# Patient Record
Sex: Male | Born: 1980 | Race: Black or African American | Hispanic: No | Marital: Single | Smoking: Former smoker
Health system: Southern US, Community
[De-identification: ages and names within clinical notes are randomized; demographics above are authoritative.]

## PROBLEM LIST (undated history)

## (undated) DIAGNOSIS — E119 Type 2 diabetes mellitus without complications: Secondary | ICD-10-CM

## (undated) DIAGNOSIS — I10 Essential (primary) hypertension: Secondary | ICD-10-CM

## (undated) DIAGNOSIS — Z8619 Personal history of other infectious and parasitic diseases: Secondary | ICD-10-CM

## (undated) HISTORY — PX: CATARACT EXTRACTION: SUR2

## (undated) HISTORY — PX: REPAIR KNEE LIGAMENT: SUR1188

---

## 2000-08-03 ENCOUNTER — Emergency Department (HOSPITAL_COMMUNITY): Admission: EM | Admit: 2000-08-03 | Discharge: 2000-08-03 | Payer: Self-pay | Admitting: Emergency Medicine

## 2001-10-15 ENCOUNTER — Ambulatory Visit (HOSPITAL_COMMUNITY): Admission: RE | Admit: 2001-10-15 | Discharge: 2001-10-15 | Payer: Self-pay | Admitting: Family Medicine

## 2001-10-15 ENCOUNTER — Encounter: Payer: Self-pay | Admitting: Family Medicine

## 2004-09-26 ENCOUNTER — Emergency Department (HOSPITAL_COMMUNITY): Admission: EM | Admit: 2004-09-26 | Discharge: 2004-09-26 | Payer: Self-pay | Admitting: *Deleted

## 2006-07-04 ENCOUNTER — Emergency Department (HOSPITAL_COMMUNITY): Admission: EM | Admit: 2006-07-04 | Discharge: 2006-07-04 | Payer: Self-pay | Admitting: Emergency Medicine

## 2010-05-24 NOTE — H&P (Signed)
   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 The  patient states that he was instructed to be seen by somebody from our group  upon getting x-rays of the right knee. On physical examination, the lateral  joint line of the right knee was minimal tenderness with positive moderate  edema, localized area along the tendon sheath. The patient had no erythema  and reports bilateral sensation L4 through S1 dermatomes distally, intact  and equal. Range of motion extensor hallus longus and dorsiflexion and  plantarflexion were both 5/5 and equal bilaterally. Knee joint, full  extension of about 120 degrees of flexion with no difficulty. The patient  does state that wavering activities for a long time do aggravate the lateral  aspect of the knee and swelling increases over time. One example was, after  playing basketball for two hours.   PAST MEDICAL HISTORY:  None.   PAST SURGICAL HISTORY:  Incision and drainage of the left knee.   MEDICATIONS:  The patient was given prescription for Vioxx from [facility]  and that is actually who sent him over to radiology, not our group.   SOCIAL HISTORY:  Positive for tobacco use. Negative ethanol use. The patient  does have two roommates that he lives with.   ALLERGIES:  No known drug allergies.   IMPRESSION:  Right lateral knee bursitis versus cyst.    PLAN:  For the patient to follow-up in our office in one  week to see Dr.  Beola Cord for evaluation, possible MRI. I instructed the patient to elevate os  and continue the Vioxx. Not to play basketball. I gave him a work note for  light duty. No bending, squatting, or lifting of heavy materials. This was  discussed with Dr. Beola Cord.                                                Gerri Lins, [place]    MC/MEDQ  D:  10/21/2001  T:  10/22/2001  Job:  [id number]   cc:   Hunter Cooks, MD  [address]  [place]  [place] [address]  Fax: [phone]

## 2010-05-30 ENCOUNTER — Emergency Department (HOSPITAL_COMMUNITY)
Admission: EM | Admit: 2010-05-30 | Discharge: 2010-05-30 | Disposition: A | Discharge status: Home / Self Care | Payer: No Typology Code available for payment source | Attending: Emergency Medicine | Admitting: Emergency Medicine

## 2010-05-30 DIAGNOSIS — S335XXA Sprain of ligaments of lumbar spine, initial encounter: Secondary | ICD-10-CM | POA: Insufficient documentation

## 2010-05-30 DIAGNOSIS — E119 Type 2 diabetes mellitus without complications: Secondary | ICD-10-CM | POA: Insufficient documentation

## 2010-05-30 DIAGNOSIS — M549 Dorsalgia, unspecified: Secondary | ICD-10-CM | POA: Insufficient documentation

## 2013-02-01 ENCOUNTER — Encounter (HOSPITAL_COMMUNITY): Payer: Self-pay | Admitting: Emergency Medicine

## 2013-02-01 ENCOUNTER — Emergency Department (HOSPITAL_COMMUNITY): Payer: No Typology Code available for payment source

## 2013-02-01 ENCOUNTER — Emergency Department (HOSPITAL_COMMUNITY)
Admission: EM | Admit: 2013-02-01 | Discharge: 2013-02-01 | Disposition: A | Discharge status: Home / Self Care | Payer: No Typology Code available for payment source | Attending: Emergency Medicine | Admitting: Emergency Medicine

## 2013-02-01 DIAGNOSIS — E119 Type 2 diabetes mellitus without complications: Secondary | ICD-10-CM | POA: Insufficient documentation

## 2013-02-01 DIAGNOSIS — R5381 Other malaise: Secondary | ICD-10-CM | POA: Insufficient documentation

## 2013-02-01 DIAGNOSIS — E860 Dehydration: Secondary | ICD-10-CM | POA: Insufficient documentation

## 2013-02-01 DIAGNOSIS — J111 Influenza due to unidentified influenza virus with other respiratory manifestations: Secondary | ICD-10-CM | POA: Insufficient documentation

## 2013-02-01 DIAGNOSIS — F17200 Nicotine dependence, unspecified, uncomplicated: Secondary | ICD-10-CM | POA: Insufficient documentation

## 2013-02-01 DIAGNOSIS — Z79899 Other long term (current) drug therapy: Secondary | ICD-10-CM | POA: Insufficient documentation

## 2013-02-01 DIAGNOSIS — R5383 Other fatigue: Secondary | ICD-10-CM

## 2013-02-01 HISTORY — DX: Type 2 diabetes mellitus without complications: E11.9

## 2013-02-01 LAB — CBC WITH DIFFERENTIAL/PLATELET
Basophils Absolute: 0 10*3/uL (ref 0.0–0.1)
Basophils Relative: 0 % (ref 0–1)
Eosinophils Absolute: 0 10*3/uL (ref 0.0–0.7)
Eosinophils Relative: 1 % (ref 0–5)
HCT: 43.5 % (ref 39.0–52.0)
Hemoglobin: 15.6 g/dL (ref 13.0–17.0)
Lymphocytes Relative: 15 % (ref 12–46)
Lymphs Abs: 0.8 10*3/uL (ref 0.7–4.0)
MCH: 29.9 pg (ref 26.0–34.0)
MCHC: 35.9 g/dL (ref 30.0–36.0)
MCV: 83.5 fL (ref 78.0–100.0)
Monocytes Absolute: 0.6 10*3/uL (ref 0.1–1.0)
Monocytes Relative: 11 % (ref 3–12)
Neutro Abs: 3.9 10*3/uL (ref 1.7–7.7)
Neutrophils Relative %: 74 % (ref 43–77)
Platelets: 195 10*3/uL (ref 150–400)
RBC: 5.21 MIL/uL (ref 4.22–5.81)
RDW: 11.7 % (ref 11.5–15.5)
WBC: 5.3 10*3/uL (ref 4.0–10.5)

## 2013-02-01 LAB — GLUCOSE, CAPILLARY
GLUCOSE-CAPILLARY: 250 mg/dL — AB (ref 70–99)
Glucose-Capillary: 300 mg/dL — ABNORMAL HIGH (ref 70–99)

## 2013-02-01 LAB — COMPREHENSIVE METABOLIC PANEL
ALT: 21 U/L (ref 0–53)
AST: 19 U/L (ref 0–37)
Albumin: 4.1 g/dL (ref 3.5–5.2)
Alkaline Phosphatase: 68 U/L (ref 39–117)
BUN: 25 mg/dL — ABNORMAL HIGH (ref 6–23)
CO2: 24 mEq/L (ref 19–32)
Calcium: 9 mg/dL (ref 8.4–10.5)
Chloride: 100 mEq/L (ref 96–112)
Creatinine, Ser: 1.4 mg/dL — ABNORMAL HIGH (ref 0.50–1.35)
GFR calc Af Amer: 75 mL/min — ABNORMAL LOW (ref >90)
GFR calc non Af Amer: 64 mL/min — ABNORMAL LOW (ref >90)
Glucose, Bld: 323 mg/dL — ABNORMAL HIGH (ref 70–99)
Potassium: 4.6 mEq/L (ref 3.7–5.3)
Sodium: 141 mEq/L (ref 137–147)
Total Bilirubin: 0.4 mg/dL (ref 0.3–1.2)
Total Protein: 7.8 g/dL (ref 6.0–8.3)

## 2013-02-01 LAB — URINALYSIS, ROUTINE W REFLEX MICROSCOPIC
Bilirubin Urine: NEGATIVE
Hgb urine dipstick: NEGATIVE
Ketones, ur: 40 mg/dL — AB
LEUKOCYTES UA: NEGATIVE
NITRITE: NEGATIVE
PH: 5 (ref 5.0–8.0)
Protein, ur: NEGATIVE mg/dL
Urobilinogen, UA: 0.2 mg/dL (ref 0.0–1.0)

## 2013-02-01 LAB — URINE MICROSCOPIC-ADD ON

## 2013-02-01 MED ORDER — OSELTAMIVIR PHOSPHATE 75 MG PO CAPS: 75.0000 mg | ORAL_CAPSULE | Freq: Two times a day (BID) | ORAL | Status: DC

## 2013-02-01 MED ORDER — SODIUM CHLORIDE 0.9 % IV BOLUS (SEPSIS)
1000.0000 mL | Freq: Once | INTRAVENOUS | Status: AC
  Administered 2013-02-01: 1000 mL via INTRAVENOUS

## 2013-02-01 NOTE — ED Notes (Signed)
Pt reports all over body aches, fatigue, dry cough, sneezing and chest congestion x2 days.

## 2013-02-01 NOTE — Discharge Instructions (Signed)
Influenza, Adult Influenza (flu) is an infection in the mouth, nose, and throat (respiratory tract) caused by a virus. The flu can make you feel very ill. Influenza spreads easily from person to person (contagious).  HOME CARE   Only take medicines as told by your doctor.  Use a cool mist humidifier to make breathing easier.  Get plenty of rest until your fever goes away. This usually takes 3 to 4 days.  Drink enough fluids to keep your pee (urine) clear or pale yellow.  Cover your mouth and nose when you cough or sneeze.  Wash your hands well to avoid spreading the flu.  Stay home from work or school until your fever has been gone for at least 1 full day.  Get a flu shot every year. GET HELP RIGHT AWAY IF:   You have trouble breathing or feel short of breath.  Your skin or nails turn blue.  You have severe neck pain or stiffness.  You have a severe headache, facial pain, or earache.  Your fever gets worse or keeps coming back.  You feel sick to your stomach (nauseous), throw up (vomit), or have watery poop (diarrhea).  You have chest pain.  You have a deep cough that gets worse, or you cough up more thick spit (mucus). MAKE SURE YOU:   Understand these instructions.  Will watch your condition.  Will get help right away if you are not doing well or get worse. Document Released: 10/02/2007 Document Revised: 06/24/2011 Document Reviewed: 03/24/2011 [facility] Patient Information 2014 [place], [place].   Emergency Department Resource Guide 1) Find a Doctor and Pay Out of Pocket Although you won't have to find out who is covered by your insurance plan, it is a good idea to ask around and get recommendations. You will then need to call the office and see if the doctor you have chosen will accept you as a new patient and what types of options they offer for patients who are self-pay. Some doctors offer discounts or will set up payment plans for their patients who do not have  insurance, but you will need to ask so you aren't surprised when you get to your appointment.  2) Contact Your Local Health Department Not all health departments have doctors that can see patients for sick visits, but many do, so it is worth a call to see if yours does. If you don't know where your local health department is, you can check in your phone book. The CDC also has a tool to help you locate your state's health department, and many state websites also have listings of all of their local health departments.  3) Find a [facility] If your illness is not likely to be very severe or complicated, you may want to try a walk in clinic. These are popping up all over the country in pharmacies, drugstores, and shopping centers. They're usually staffed by nurse practitioners or physician assistants that have been trained to treat common illnesses and complaints. They're usually fairly quick and inexpensive. However, if you have serious medical issues or chronic medical problems, these are probably not your best option.  No Primary Care Doctor: - Call Health Connect at  [phone] - they can help you locate a primary care doctor that  accepts your insurance, provides certain services, etc. - Physician Referral Service- [phone]  Chronic Pain Problems: Organization         Address  Phone   Notes  [facility]  [phone] Patients need to be referred by their primary care doctor.   Medication Assistance: Organization         Address  Phone   Notes  [facility] Medication [facility] [address] [place], [place] [address] [phone] --Must be a resident of [facility] -- Must have NO insurance coverage whatsoever (no Medicaid/ Medicare, etc.) -- The pt. MUST have a primary care doctor that directs their care regularly and follows them in the community   MedAssist  [phone]   [employer]  [phone]    Agencies that provide  inexpensive medical care: Organization         Address  Phone   Notes  [place]  [phone]   [facility]    [phone]   [facility] [place], [place] [address] [phone]   [place] [address], [place] [phone]   Planned Parenthood    [phone]   [facility]    [phone]   [place] and [address], [place] Phone:  [phone], Fax:  [phone] Hours of Operation:  9 am - 6 pm, M-F.  Also accepts Medicaid/Medicare and self-pay.  [facility] for [place], [address], [place] Phone: [phone], Fax: [phone]. Hours of Operation:  8:30 am - 5:30 pm, M-F.  Also accepts Medicaid and self-pay.  [facility] [address], [place] Phone: [phone]   [place], [place], [place] [phone], Ext. [phone] Mondays & Thursdays: 7-9 AM.  First 15 patients are seen on a first come, first serve basis.    [place] Providers:  Organization         Address  Phone   Notes  [facility] [address], [place] [phone] Also accepts self-pay patients.  [facility] [id number] [place], [place]  [phone]   [place], [address], [place] [phone]   [facility] [address], [place] [phone]   Lucianne Lei [address], [place]   [phone] Only accepts [place] Access [place] patients after they have their name applied to their card.   Self-Pay (no insurance) in [facility]:  Organization         Address  Phone   Notes  Sickle Cell Patients, [facility] [place] [phone]   [facility] Urgent  Care [place] [phone]   [facility] [place]  [place], [place], [place] [phone]   [facility]/Dr. Osei-Bonsu  [address] [place] or [address], [place] [phone] Phone number for both [place] and [place] locations is the same.  Urgent Medical and [facility] [address] [place] [phone]   [facility] [address] [place] or [address] [phone]   [facility] [address] [place] [phone], phone; [phone], fax Sees patients 1st and 3rd Saturday of every month.  Must not qualify for public or private insurance (i.e. Medicaid, Medicare, [place] Health Choice, Veterans' Benefits)  Household income should be no more than 200% of the poverty level The clinic cannot treat you if you are pregnant or think you are pregnant  Sexually transmitted diseases are not treated at the clinic.    Dental Care: Organization         Address  Phone  Notes  [facility] Department of [facility] [phone] Accepts children up to age 42 who are enrolled in [place] or [place]; pregnant women with a Medicaid card; and children who have applied for Medicaid or [place] Health Choice, but were declined, whose parents can pay a reduced fee at time of service.  [facility] Department of [facility]  [address], [place] [phone] Accepts children up to age 42 who are enrolled in [place] or [place]; pregnant women with a Medicaid card; and children who have applied for Medicaid or [place] Health Choice, but were declined, whose parents can pay a reduced fee at time of service.  [place] Adult Dental Access PROGRAM  [place] [phone] Patients are seen by appointment only. Walk-ins are  not accepted. [place] will see patients 19 years of age and older. Monday - Tuesday (8am-5pm) Most Wednesdays (8:30-5pm) $30 per visit, cash only  [facility] Adult Dental Access PROGRAM  [address], [facility] [phone] Patients are seen by appointment only. Walk-ins are not accepted. [place] will see patients 7 years of age and older. One Wednesday Evening (Monthly: Volunteer Based).  $30 per visit, cash only  [place]  [phone] for adults; Children under age 52, call Graduate Pediatric Dentistry at [phone]. Children aged 91-14, please call [phone] to request a pediatric application.  Dental services are provided in all areas of dental care including fillings, crowns and bridges, complete and partial dentures, implants, gum treatment, root canals, and extractions. Preventive care is also provided. Treatment is provided to both adults and children. Patients are selected via a lottery and there is often a waiting list.   [facility] [address], [place]  [phone] [email or web address]   [facility] [address] [place], [place] [phone], Ext. [phone] Second and Fourth Thursday of each month, opens at 6:30 AM; Clinic ends at 9 AM.  Patients are seen on a first-come first-served basis, and a limited number are seen during each clinic.   [facility]  [address] Hillard Danker [place], [place] [phone]   Eligibility Requirements You must have lived in [place], [place], or [place] counties for at least the last three months.   You cannot be eligible for state or federal sponsored [employer], including [employer], [place], or [employer].   You generally cannot be eligible for healthcare insurance through your employer.    How to apply: Eligibility screenings are held every Tuesday and Wednesday afternoon from 1:00 pm until 4:00 pm. You do not need an appointment for  the interview!  [facility] [address], [place], [place]   [place]  [place] Department  [place]  [phone]    Behavioral Health Resources in the Community: Intensive Outpatient Programs Organization         Address  Phone  Notes  [place] Henderson. [address] [place],  [place] [phone]   [facility] [address], [place], [place]   ADS: Alcohol & Drug Svcs [address] [place], [place]   [place] [address],  [place], [place] or [phone]   Substance Abuse Resources Organization         Address  Phone  Notes  Alcohol and Drug Services  [phone]   [place]  [phone]   [facility] [place]   Chinita Pester  [phone]   Residential & Outpatient Substance Abuse Program  [phone]   Psychological Services Organization         Address  Phone  Notes  [facility]  [place]  [phone]   [place] [address], [place] or [phone]    Mobile Crisis Teams Organization         Address  Phone  Notes  [facility]atives, Mobile Crisis Care Unit  [phone]   Assertive Psychotherapeutic Services  [address] [place], [place]   [place] Levels [address] [place] [phone]    Self-Help/Support Groups Organization         Address  Phone             Notes  [place]. of [place] - variety of support groups  [place] Call for more information  Narcotics Anonymous (NA), Caring Services [address], [employer]   2 meetings at this location   Special educational needs teacher         Address  Phone  Notes  ASAP Residential Treatment  [place],    [place]  [phone]   [facility]  [address] [place] [phone], [place], [place]   [place], [place] [phone] Admissions: 8am-3pm M-F  Incentives Substance [place] [address],    [place], [place] [phone]   [facility] [address] [place], [place], [place]   [facility] [address],  [place], [place]   Insight Programs - Intensive Outpatient [address], [place], [place]   [facility] ([place]mit.) [place].,  [place], [place] [phone] or [phone]   Residential Treatment Services (RTS) [address] [place], [place] Accepts Medicaid  [facility] [place] [address]  [place] [phone] Substance Abuse/Addiction Treatment   [facility] Organization         Address  Phone  Notes  [facility]  [phone]   Domenic Schwab, PhD [address] Arlis Porta [place], [place]   [phone] or [phone]   [place], [place] [phone]   [facility] [address] [place], [place] [phone] Insurance/Medicaid/sponsorship through [facility] and Families [address] [place]                                    [place], [place] [phone] [place] [address], [place] [phone]    Dr. Adele Schilder  [phone]   [facility] Dept. 1) [address], [place] 2) [address], [place] 3)  [place], [place] [phone]  [phone]   [facility] Child Abuse Hotline [phone] or [phone] (After Hours)

## 2013-02-01 NOTE — ED Provider Notes (Signed)
CSN: [id number]     Arrival date & time 02/01/13  [id number] History   First MD Initiated Contact with Patient 02/01/13 [phone]     Chief Complaint  Patient presents with  . Fatigue  . Cough   (Consider location/radiation/quality/duration/timing/severity/associated sxs/prior Treatment) HPI Comments: Patient is a 33 year old male with history of diabetes who presents today with generalized body aches, fatigue, cough, congestion. He reports that all this began Sunday afternoon. He been gradually worsening since that time. The cough was the first thing to start. The cough is worse at night and is not productive. He took one dose of a generic allergy medication without relief of his symptoms. This was on Sunday. He reports that he feels feverish, but was told that he does not have a fever. He has not taken any anti-pyretics. He has a 18-year-old son who is also sick. He takes metformin and glipizide for his diabetes. He has been compliant with these medications. He reports that he does not have great control of his diabetes well, but his blood sugar has never been as high as it was today. His blood sugar was 300 in triage. He admits to worsening polydipsia and polyuria over the past few days.  Patient is a 33 y.o. male presenting with cough. The history is provided by the patient. No language interpreter was used.  Cough Associated symptoms: rhinorrhea and sore throat   Associated symptoms: no chest pain, no chills, no fever and no shortness of breath     Past Medical History  Diagnosis Date  . Diabetes mellitus without complication    Past Surgical History  Procedure Laterality Date  . Repair knee ligament     History reviewed. No pertinent family history. History  Substance Use Topics  . Smoking status: Current Every Day Smoker  . Smokeless tobacco: Not on file  . Alcohol Use: Yes    Review of Systems  Constitutional: Negative for fever and chills.  HENT: Positive for congestion, rhinorrhea,  sneezing and sore throat.   Respiratory: Positive for cough. Negative for shortness of breath.   Cardiovascular: Negative for chest pain.  Gastrointestinal: Negative for nausea, vomiting and abdominal pain.  All other systems reviewed and are negative.    Allergies  Review of patient's allergies indicates no known allergies.  Home Medications   Current Outpatient Rx  Name  Route  Sig  Dispense  Refill  . glipiZIDE (GLUCOTROL) 5 MG tablet   Oral   Take 5 mg by mouth daily before breakfast.         . metFORMIN (GLUCOPHAGE) 500 MG tablet   Oral   Take 500 mg by mouth 2 (two) times daily with a meal.         . Pseudoephedrine-APAP-DM (DAYQUIL PO)   Oral   Take 2 capsules by mouth every 6 (six) hours as needed.          BP 100/60  Pulse 103  Temp(Src) 98 F (36.7 C) (Oral)  Resp 18  SpO2 99% Physical Exam  Nursing note and vitals reviewed. Constitutional: He is oriented to person, place, and time. He appears well-developed and well-nourished. He does not appear ill. No distress.  HENT:  Head: Normocephalic and atraumatic.  Right Ear: Tympanic membrane, external ear and ear canal normal.  Left Ear: Tympanic membrane, external ear and ear canal normal.  Nose: Nose normal.  Mouth/Throat: Uvula is midline. Mucous membranes are dry. No trismus in the jaw.  Eyes: Conjunctivae are normal.  Neck:  Normal range of motion. No tracheal deviation present.  No nuchal rigidity or meningeal signs  Cardiovascular: Normal rate, regular rhythm, normal heart sounds, intact distal pulses and normal pulses.   Pulmonary/Chest: Effort normal and breath sounds normal. No stridor.  Abdominal: Soft. He exhibits no distension. There is no tenderness.  Musculoskeletal: Normal range of motion.  Neurological: He is alert and oriented to person, place, and time.  Skin: Skin is warm and dry. He is not diaphoretic.  Psychiatric: He has a normal mood and affect. His behavior is normal.    ED  Course  Procedures (including critical care time) Labs Review Labs Reviewed  COMPREHENSIVE METABOLIC PANEL - Abnormal; Notable for the following:    Glucose, Bld 323 (*)    BUN 25 (*)    Creatinine, Ser 1.40 (*)    GFR calc non Af Amer 64 (*)    GFR calc Af Amer 75 (*)    All other components within normal limits  GLUCOSE, CAPILLARY - Abnormal; Notable for the following:    Glucose-Capillary 300 (*)    All other components within normal limits  URINALYSIS, ROUTINE W REFLEX MICROSCOPIC - Abnormal; Notable for the following:    Specific Gravity, Urine >1.046 (*)    Glucose, UA >1000 (*)    Ketones, ur 40 (*)    All other components within normal limits  GLUCOSE, CAPILLARY - Abnormal; Notable for the following:    Glucose-Capillary 250 (*)    All other components within normal limits  URINE MICROSCOPIC-ADD ON - Abnormal; Notable for the following:    Casts HYALINE CASTS (*)    All other components within normal limits  CBC WITH DIFFERENTIAL   Imaging Review Dg Chest 2 View  02/01/2013   CLINICAL DATA:  Cough, fatigue.  EXAM: CHEST  2 VIEW  COMPARISON:  None.  FINDINGS: Normal cardiac and mediastinal contours. No consolidative pulmonary opacities. No pleural effusion or pneumothorax. Regional skeleton is unremarkable.  IMPRESSION: No acute cardiopulmonary process.   Electronically Signed   By: Lovey Newcomer M.D.   On: 02/01/2013 10:18    EKG Interpretation   None       MDM   1. Influenza   2. Diabetes   3. Dehydration    Influenza-like illness. Lab work shows normal electrolytes. Glucose is elevated at 323 on CBC. Patient was given fluids. Glucose was reduced to 250. Chest x-ray shows no acute cardiopulmonary process. He was encouraged to followup with his primary care physician about his diabetes. He falls into a high risk category and was given Tamiflu. Reasons return to the emergency department immediately we discussed. Vital signs stable for discharge. Discussed case with  Dr. Tomi Bamberger who agrees with plan. Patient / Family / Caregiver informed of clinical course, understand medical decision-making process, and agree with plan.     Elwyn Lade, PA-C 02/01/13 1559

## 2013-02-01 NOTE — Progress Notes (Signed)
   CARE MANAGEMENT ED NOTE 02/01/2013  Patient:  KERIM, MATZKE   Account Number:  [id number]  Date Initiated:  02/01/2013  Documentation initiated by:  [facility]  Subjective/Objective Assessment:   Pt presented today to [facility] ED with c/o cough fatiigue     Subjective/Objective Assessment Detail:   Patient is a 33 year old male with history of diabetes who presents today with generalized body aches, fatigue, cough, congestion. since Sunday     Action/Plan:   Action/Plan Detail:   Anticipated DC Date:  01/25/2013     Status Recommendation to Physician:   Result of Recommendation:  Agreed  Other ED Services  Appropriate Status [place]  CM consult  [place] / P4HM (established/new)    Choice offered to / List presented to:  C-1 Patient          Status of service:  Completed, signed off  ED Comments:   ED Comments Detail:  02/01/2013 [address] Stann Mainland RN BSN [facility] ED CM consulted by [employer] nurse in regards to medication assitance. Pt presented to [facility] ED with c/o of cough and fatigue. Pt has a hx of DM BS=300 in ED. Pt does not have insurance or a PCP.  In room to meet with patient. Pt reports [facility] and not having insurance or PCP. Pt iis MATCH eligble.  Pt is being discharged with Tamiflu. Contacted Elizabeth in [place] regarding [place] for Tamiflu. Tamiflu should be covered as per Benjamine Mola. Discussed MATCH and guidelines $3.00/precription co-pay patient verbalized understanding and is agreeable with plan. Pt enrolled in [facility] letter printedand given to patient. Pt instructed to fill in [place], directions given. Discussed the discharge plan with Kathrynn Speed. she is in agreement. No further ED CM needs identified.

## 2013-02-01 NOTE — ED Notes (Signed)
Hunter Villarreal, Case manager is in speaking with pt. Concerning Tamiflu.

## 2013-02-01 NOTE — ED Notes (Signed)
CBG 300 in triage. 

## 2013-02-02 NOTE — ED Provider Notes (Signed)
Medical screening examination/treatment/procedure(s) were performed by non-physician practitioner and as supervising physician I was immediately available for consultation/collaboration.    Kathalene Frames, MD 02/02/13 [phone]

## 2013-02-03 NOTE — Progress Notes (Signed)
ED CM consulted by CM office after pt called [facility] CM office to assist with [place] concerns at 1226.  CM spoke with pt who states [facility] [address] reports a claim issue with [facility] letter acceptance. CM spoke with [place] at [phone] at 1240. CM re entered [place] information at 7.  Kriste Basque confirmed PDMI information entered and accepted CM called Pt to updated him and encouraged him to request to speak with Kriste Basque if [facility] are [facility] encouraged pt to return call to CM if further concerns CM office contact information provided

## 2013-04-05 ENCOUNTER — Encounter (HOSPITAL_COMMUNITY): Payer: Self-pay | Admitting: Emergency Medicine

## 2013-04-05 ENCOUNTER — Emergency Department (HOSPITAL_COMMUNITY): Payer: Self-pay

## 2013-04-05 ENCOUNTER — Emergency Department (HOSPITAL_COMMUNITY)
Admission: EM | Admit: 2013-04-05 | Discharge: 2013-04-06 | Disposition: A | Discharge status: Home / Self Care | Payer: Self-pay | Attending: Emergency Medicine | Admitting: Emergency Medicine

## 2013-04-05 DIAGNOSIS — R739 Hyperglycemia, unspecified: Secondary | ICD-10-CM

## 2013-04-05 DIAGNOSIS — Z79899 Other long term (current) drug therapy: Secondary | ICD-10-CM | POA: Insufficient documentation

## 2013-04-05 DIAGNOSIS — K529 Noninfective gastroenteritis and colitis, unspecified: Secondary | ICD-10-CM

## 2013-04-05 DIAGNOSIS — K5289 Other specified noninfective gastroenteritis and colitis: Secondary | ICD-10-CM | POA: Insufficient documentation

## 2013-04-05 DIAGNOSIS — E119 Type 2 diabetes mellitus without complications: Secondary | ICD-10-CM | POA: Insufficient documentation

## 2013-04-05 DIAGNOSIS — F17200 Nicotine dependence, unspecified, uncomplicated: Secondary | ICD-10-CM | POA: Insufficient documentation

## 2013-04-05 DIAGNOSIS — R059 Cough, unspecified: Secondary | ICD-10-CM | POA: Insufficient documentation

## 2013-04-05 DIAGNOSIS — R05 Cough: Secondary | ICD-10-CM | POA: Insufficient documentation

## 2013-04-05 LAB — URINALYSIS, ROUTINE W REFLEX MICROSCOPIC
Bilirubin Urine: NEGATIVE
Glucose, UA: >1000 mg/dL — AB
Hgb urine dipstick: NEGATIVE
LEUKOCYTES UA: NEGATIVE
NITRITE: NEGATIVE
PH: 5 (ref 5.0–8.0)
PROTEIN: NEGATIVE mg/dL
Specific Gravity, Urine: 1.02 (ref 1.005–1.030)
Urobilinogen, UA: 0.2 mg/dL (ref 0.0–1.0)

## 2013-04-05 LAB — CBC WITH DIFFERENTIAL/PLATELET
BASOS ABS: 0 10*3/uL (ref 0.0–0.1)
Basophils Relative: 0 % (ref 0–1)
Eosinophils Absolute: 0 10*3/uL (ref 0.0–0.7)
Eosinophils Relative: 0 % (ref 0–5)
HEMATOCRIT: 36.7 % — AB (ref 39.0–52.0)
Hemoglobin: 13 g/dL (ref 13.0–17.0)
LYMPHS ABS: 1.6 10*3/uL (ref 0.7–4.0)
Lymphocytes Relative: 15 % (ref 12–46)
MCH: 29.8 pg (ref 26.0–34.0)
MCHC: 35.4 g/dL (ref 30.0–36.0)
MCV: 84.2 fL (ref 78.0–100.0)
MONO ABS: 0.5 10*3/uL (ref 0.1–1.0)
Monocytes Relative: 5 % (ref 3–12)
Neutro Abs: 8.7 10*3/uL — ABNORMAL HIGH (ref 1.7–7.7)
Neutrophils Relative %: 80 % — ABNORMAL HIGH (ref 43–77)
PLATELETS: 303 10*3/uL (ref 150–400)
RBC: 4.36 MIL/uL (ref 4.22–5.81)
RDW: 12.2 % (ref 11.5–15.5)
WBC: 10.8 10*3/uL — ABNORMAL HIGH (ref 4.0–10.5)

## 2013-04-05 LAB — COMPREHENSIVE METABOLIC PANEL
ALBUMIN: 3.4 g/dL — AB (ref 3.5–5.2)
ALT: 16 U/L (ref 0–53)
AST: 17 U/L (ref 0–37)
Alkaline Phosphatase: 64 U/L (ref 39–117)
BILIRUBIN TOTAL: 0.7 mg/dL (ref 0.3–1.2)
BUN: 24 mg/dL — AB (ref 6–23)
CHLORIDE: 91 meq/L — AB (ref 96–112)
CO2: 20 mEq/L (ref 19–32)
CREATININE: 1.41 mg/dL — AB (ref 0.50–1.35)
Calcium: 8.5 mg/dL (ref 8.4–10.5)
GFR calc Af Amer: 74 mL/min — ABNORMAL LOW (ref >90)
GFR, EST NON AFRICAN AMERICAN: 64 mL/min — AB (ref >90)
Glucose, Bld: 409 mg/dL — ABNORMAL HIGH (ref 70–99)
Potassium: 4.7 mEq/L (ref 3.7–5.3)
Sodium: 136 mEq/L — ABNORMAL LOW (ref 137–147)
Total Protein: 7.3 g/dL (ref 6.0–8.3)

## 2013-04-05 LAB — URINE MICROSCOPIC-ADD ON

## 2013-04-05 LAB — CBG MONITORING, ED: Glucose-Capillary: 211 mg/dL — ABNORMAL HIGH (ref 70–99)

## 2013-04-05 LAB — LIPASE, BLOOD: LIPASE: 38 U/L (ref 11–59)

## 2013-04-05 MED ORDER — INSULIN ASPART 100 UNIT/ML ~~LOC~~ SOLN
5.0000 [IU] | Freq: Once | SUBCUTANEOUS | Status: AC
  Administered 2013-04-05: 5 [IU] via SUBCUTANEOUS
  Filled 2013-04-05: qty 1

## 2013-04-05 MED ORDER — SODIUM CHLORIDE 0.9 % IV BOLUS (SEPSIS)
1000.0000 mL | Freq: Once | INTRAVENOUS | Status: AC
Start: 2013-04-05 — End: 2013-04-05
  Administered 2013-04-05: 1000 mL via INTRAVENOUS

## 2013-04-05 MED ORDER — SODIUM CHLORIDE 0.9 % IV BOLUS (SEPSIS)
1000.0000 mL | Freq: Once | INTRAVENOUS | Status: AC
  Administered 2013-04-05: 1000 mL via INTRAVENOUS

## 2013-04-05 MED ORDER — HYDROCOD POLST-CHLORPHEN POLST 10-8 MG/5ML PO LQCR
5.0000 mL | Freq: Once | ORAL | Status: AC
  Administered 2013-04-05: 5 mL via ORAL
  Filled 2013-04-05: qty 5

## 2013-04-05 NOTE — ED Notes (Signed)
Pt reports N/V on Monday, but today has nausea. States he had diarrhea appx 1 week ago. Pt reports intermittent chills. Pt reports eating sandwich/gatorade earlier today with no issue. Denies abdominal pain. NAD.

## 2013-04-05 NOTE — ED Provider Notes (Signed)
CSN: [id number]     Arrival date & time 04/05/13  1810 History   First MD Initiated Contact with Patient 04/05/13 2202     Chief Complaint  Patient presents with  . Emesis  . Diarrhea     (Consider location/radiation/quality/duration/timing/severity/associated sxs/prior Treatment) HPI  Patient presents to the ER with complaints of nausea and vomiting with diarrhea that started approximately 1 week ago. He is a diabetic and reports his glucose started to elevate because he had not been taking his diabetes medications. He was able to take this on 04/05/2013 and has not had any vomiting today. But he continues to have diarrhea with intermittent chills and a cough. He says that the diarrhea is very soft stool without blood or a foul odor. He denies having increased thirst, weakness, confusion, or abdominal pains. He reports that he has been going to work but has not been feeling up to working lately. He is not in any distress and is resting comfortably.   (diagnosed with Diabetes in 2015- according to notes)  Past Medical History  Diagnosis Date  . Diabetes mellitus without complication    Past Surgical History  Procedure Laterality Date  . Repair knee ligament     No family history on file. History  Substance Use Topics  . Smoking status: Current Every Day Smoker -- 0.50 packs/day    Types: Cigarettes  . Smokeless tobacco: Not on file  . Alcohol Use: Yes    Review of Systems  The patient denies anorexia, fever, weight loss,, vision loss, decreased hearing, hoarseness, chest pain, syncope, dyspnea on exertion, peripheral edema, balance deficits, hemoptysis,  melena, hematochezia, severe indigestion/heartburn, hematuria, incontinence, genital sores, muscle weakness, suspicious skin lesions, transient blindness, difficulty walking, depression, unusual weight change, abnormal bleeding, enlarged lymph nodes, angioedema, and breast masses.   Allergies  Review of patient's allergies  indicates no known allergies.  Home Medications   Current Outpatient Rx  Name  Route  Sig  Dispense  Refill  . glipiZIDE (GLUCOTROL) 5 MG tablet   Oral   Take 5 mg by mouth daily before breakfast.         . metFORMIN (GLUCOPHAGE) 500 MG tablet   Oral   Take 500 mg by mouth 2 (two) times daily with a meal.          BP 110/61  Pulse 87  Temp(Src) 100.3 F (37.9 C) (Oral)  Resp 22  SpO2 97% Physical Exam  Nursing note and vitals reviewed. Constitutional: He appears well-developed and well-nourished. No distress.  HENT:  Head: Normocephalic and atraumatic.  Eyes: Pupils are equal, round, and reactive to light.  Neck: Normal range of motion. Neck supple.  Cardiovascular: Normal rate and regular rhythm.   Pulmonary/Chest: Effort normal and breath sounds normal.  Coughing on exam  Abdominal: Soft. Bowel sounds are normal. He exhibits no distension. There is no tenderness. There is no guarding.  Neurological: He is alert.  Skin: Skin is warm and dry.    ED Course  Procedures (including critical care time) Labs Review Labs Reviewed  CBC WITH DIFFERENTIAL - Abnormal; Notable for the following:    WBC 10.8 (*)    HCT 36.7 (*)    Neutrophils Relative % 80 (*)    Neutro Abs 8.7 (*)    All other components within normal limits  COMPREHENSIVE METABOLIC PANEL - Abnormal; Notable for the following:    Sodium 136 (*)    Chloride 91 (*)    Glucose, Bld 409 (*)  BUN 24 (*)    Creatinine, Ser 1.41 (*)    Albumin 3.4 (*)    GFR calc non Af Amer 64 (*)    GFR calc Af Amer 74 (*)    All other components within normal limits  URINALYSIS, ROUTINE W REFLEX MICROSCOPIC - Abnormal; Notable for the following:    Glucose, UA >1000 (*)    Ketones, ur >80 (*)    All other components within normal limits  CBG MONITORING, ED - Abnormal; Notable for the following:    Glucose-Capillary 211 (*)    All other components within normal limits  CBG MONITORING, ED - Abnormal; Notable for the  following:    Glucose-Capillary 178 (*)    All other components within normal limits  LIPASE, BLOOD  URINE MICROSCOPIC-ADD ON   Imaging Review Dg Abd Acute W/chest  04/05/2013   CLINICAL DATA:  One week history of cough, vomiting and diarrhea  EXAM: ACUTE ABDOMEN SERIES (ABDOMEN 2 VIEW & CHEST 1 VIEW)  COMPARISON:  Prior chest x-ray 02/01/2013  FINDINGS: a nonobstructive bowel gas pattern. Gas is noted within several loops of nondilated small bowel in the mid and right hemi abdomen. Colonic gas is noted to the level of the rectum. No free air on the upright view. No radiopaque calculi or other significant radiographic abnormality is seen. Heart size and mediastinal contours are within normal limits. Both lungs are clear.  IMPRESSION: 1. Negative chest x-ray. 2. Nonspecific air within nondilated loops of small bowel in the mid and right abdomen. Differential considerations include infectious and inflammatory enteritis. No evidence of obstruction.   Electronically Signed   By: Jacqulynn Cadet M.D.   On: 04/05/2013 23:32     EKG Interpretation None      MDM   Final diagnoses:  Enteritis    The patient had his labs drawn which showed an elevated white count and a glucose of 400. He is on oral diabetes medications and has not been taking them the past few days. He reports to normally be in the low 200-300's. His labs were otherwise close to his baseline. He says that he has NOT at any time had pain in his abdomen or bleeding and he has now gone 24 hours without vomiting.  His acute abdominal xray shows possible enteritis. In the setting of low grade fever and a slightly elevated white count, I will treat with Cipro and Flagyl. The patients glucose responded well to treatment. The patient prefers to go home over being admitted. Since he has tolerated PO in the ED without difficulty, is no longer vomiting, he had no anion gap, and his glucose is now back to normal I feel that this is reasonable.  He has been given a work-note to stay at home and rest. Will be given rx for nausea and pain medication as well as Cipro/Flagyl.  Patients repeat CBG before discharge is 178. He is encouraged to continue to check his glucose. He admits to feeling much better after the cough medication and sugar correction. Strict return to ED precautions given.  33 y.o.Hunter Villarreal's evaluation in the Emergency Department is complete. It has been determined that no acute conditions requiring further emergency intervention are present at this time. The patient/guardian have been advised of the diagnosis and plan. We have discussed signs and symptoms that warrant return to the ED, such as changes or worsening in symptoms.  Vital signs are stable at discharge. Filed Vitals:   04/06/13 0015  BP:  110/61  Pulse: 87  Temp:   Resp:     Patient/guardian has voiced understanding and agreed to follow-up with the PCP or specialist.    Linus Mako, PA-C 04/06/13 [phone]

## 2013-04-06 LAB — CBG MONITORING, ED: Glucose-Capillary: 178 mg/dL — ABNORMAL HIGH (ref 70–99)

## 2013-04-06 MED ORDER — HYDROCODONE-ACETAMINOPHEN 5-325 MG PO TABS: 1.0000 | ORAL_TABLET | ORAL | Status: DC | PRN

## 2013-04-06 MED ORDER — METRONIDAZOLE 500 MG PO TABS: 500.0000 mg | ORAL_TABLET | Freq: Two times a day (BID) | ORAL | Status: DC

## 2013-04-06 MED ORDER — ONDANSETRON 4 MG PO TBDP: 4.0000 mg | ORAL_TABLET | Freq: Three times a day (TID) | ORAL | Status: DC | PRN

## 2013-04-06 MED ORDER — METRONIDAZOLE 500 MG PO TABS
500.0000 mg | ORAL_TABLET | Freq: Once | ORAL | Status: AC
  Administered 2013-04-06: 500 mg via ORAL
  Filled 2013-04-06: qty 1

## 2013-04-06 MED ORDER — CIPROFLOXACIN HCL 500 MG PO TABS: 500.0000 mg | ORAL_TABLET | Freq: Two times a day (BID) | ORAL | Status: DC

## 2013-04-06 MED ORDER — CIPROFLOXACIN HCL 500 MG PO TABS
500.0000 mg | ORAL_TABLET | Freq: Once | ORAL | Status: AC
  Administered 2013-04-06: 500 mg via ORAL
  Filled 2013-04-06: qty 1

## 2013-04-06 NOTE — ED Provider Notes (Signed)
Medical screening examination/treatment/procedure(s) were performed by non-physician practitioner and as supervising physician I was immediately available for consultation/collaboration.   EKG Interpretation None        Mariea Clonts, MD 04/06/13 [phone]

## 2013-04-06 NOTE — Discharge Instructions (Signed)
Hyperglycemia °Hyperglycemia occurs when the glucose (sugar) in your blood is too high. Hyperglycemia can happen for many reasons, but it most often happens to people who do not know they have diabetes or are not managing their diabetes properly.  °CAUSES  °Whether you have diabetes or not, there are other causes of hyperglycemia. Hyperglycemia can occur when you have diabetes, but it can also occur in other situations that you might not be as aware of, such as: °Diabetes °· If you have diabetes and are having problems controlling your blood glucose, hyperglycemia could occur because of some of the following reasons: °· Not following your meal plan. °· Not taking your diabetes medications or not taking it properly. °· Exercising less or doing less activity than you normally do. °· Being sick. °Pre-diabetes °· This cannot be ignored. Before people develop Type 2 diabetes, they almost always have "pre-diabetes." This is when your blood glucose levels are higher than normal, but not yet high enough to be diagnosed as diabetes. Research has shown that some long-term damage to the body, especially the heart and circulatory system, may already be occurring during pre-diabetes. If you take action to manage your blood glucose when you have pre-diabetes, you may delay or prevent Type 2 diabetes from developing. °Stress °· If you have diabetes, you may be "diet" controlled or on oral medications or insulin to control your diabetes. However, you may find that your blood glucose is higher than usual in the hospital whether you have diabetes or not. This is often referred to as "stress hyperglycemia." Stress can elevate your blood glucose. This happens because of hormones put out by the body during times of stress. If stress has been the cause of your high blood glucose, it can be followed regularly by your caregiver. That way he/she can make sure your hyperglycemia does not continue to get worse or progress to  diabetes. °Steroids °· Steroids are medications that act on the infection fighting system (immune system) to block inflammation or infection. One side effect can be a rise in blood glucose. Most people can produce enough extra insulin to allow for this rise, but for those who cannot, steroids make blood glucose levels go even higher. It is not unusual for steroid treatments to "uncover" diabetes that is developing. It is not always possible to determine if the hyperglycemia will go away after the steroids are stopped. A special blood test called an A1c is sometimes done to determine if your blood glucose was elevated before the steroids were started. °SYMPTOMS °· Thirsty. °· Frequent urination. °· Dry mouth. °· Blurred vision. °· Tired or fatigue. °· Weakness. °· Sleepy. °· Tingling in feet or leg. °DIAGNOSIS  °Diagnosis is made by monitoring blood glucose in one or all of the following ways: °· A1c test. This is a chemical found in your blood. °· Fingerstick blood glucose monitoring. °· Laboratory results. °TREATMENT  °First, knowing the cause of the hyperglycemia is important before the hyperglycemia can be treated. Treatment may include, but is not be limited to: °· Education. °· Change or adjustment in medications. °· Change or adjustment in meal plan. °· Treatment for an illness, infection, etc. °· More frequent blood glucose monitoring. °· Change in exercise plan. °· Decreasing or stopping steroids. °· Lifestyle changes. °HOME CARE INSTRUCTIONS  °· Test your blood glucose as directed. °· Exercise regularly. Your caregiver will give you instructions about exercise. Pre-diabetes or diabetes which comes on with stress is helped by exercising. °· Eat wholesome,   balanced meals. Eat often and at regular, fixed times. Your caregiver or nutritionist will give you a meal plan to guide your sugar intake.  Being at an ideal weight is important. If needed, losing as little as 10 to 15 pounds may help improve blood  glucose levels. SEEK MEDICAL CARE IF:   You have questions about medicine, activity, or diet.  You continue to have symptoms (problems such as increased thirst, urination, or weight gain). SEEK IMMEDIATE MEDICAL CARE IF:   You are vomiting or have diarrhea.  Your breath smells fruity.  You are breathing faster or slower.  You are very sleepy or incoherent.  You have numbness, tingling, or pain in your feet or hands.  You have chest pain.  Your symptoms get worse even though you have been following your caregiver's orders.  If you have any other questions or concerns. Document Released: 06/18/2000 Document Revised: 03/17/2011 Document Reviewed: 04/21/2011 [facility] Patient Information 2014 [place], [place]. Diarrhea Diarrhea is frequent loose and watery bowel movements. It can cause you to feel weak and dehydrated. Dehydration can cause you to become tired and thirsty, have a dry mouth, and have decreased urination that often is dark yellow. Diarrhea is a sign of another problem, most often an infection that will not last long. In most cases, diarrhea typically lasts 2 3 days. However, it can last longer if it is a sign of something more serious. It is important to treat your diarrhea as directed by your caregive to lessen or prevent future episodes of diarrhea. CAUSES  Some common causes include:  Gastrointestinal infections caused by viruses, bacteria, or parasites.  Food poisoning or food allergies.  Certain medicines, such as antibiotics, chemotherapy, and laxatives.  Artificial sweeteners and fructose.  Digestive disorders. HOME CARE INSTRUCTIONS  Ensure adequate fluid intake (hydration): have 1 cup (8 oz) of fluid for each diarrhea episode. Avoid fluids that contain simple sugars or sports drinks, fruit juices, whole milk products, and sodas. Your urine should be clear or pale yellow if you are drinking enough fluids. Hydrate with an oral rehydration solution that you can  purchase at pharmacies, retail stores, and online. You can prepare an oral rehydration solution at home by mixing the following ingredients together:    tsp table salt.   tsp baking soda.   tsp salt substitute containing potassium chloride.  1  tablespoons sugar.  1 L (34 oz) of water.  Certain foods and beverages may increase the speed at which food moves through the gastrointestinal (GI) tract. These foods and beverages should be avoided and include:  Caffeinated and alcoholic beverages.  High-fiber foods, such as raw fruits and vegetables, nuts, seeds, and whole grain breads and cereals.  Foods and beverages sweetened with sugar alcohols, such as xylitol, sorbitol, and mannitol.  Some foods may be well tolerated and may help thicken stool including:  Starchy foods, such as rice, toast, pasta, low-sugar cereal, oatmeal, grits, baked potatoes, crackers, and bagels.  Bananas.  Applesauce.  Add probiotic-rich foods to help increase healthy bacteria in the GI tract, such as yogurt and fermented milk products.  Wash your hands well after each diarrhea episode.  Only take over-the-counter or prescription medicines as directed by your caregiver.  Take a warm bath to relieve any burning or pain from frequent diarrhea episodes. SEEK IMMEDIATE MEDICAL CARE IF:   You are unable to keep fluids down.  You have persistent vomiting.  You have blood in your stool, or your stools are black and  tarry.  You do not urinate in 6 8 hours, or there is only a small amount of very dark urine.  You have abdominal pain that increases or localizes.  You have weakness, dizziness, confusion, or lightheadedness.  You have a severe headache.  Your diarrhea gets worse or does not get better.  You have a fever or persistent symptoms for more than 2 3 days.  You have a fever and your symptoms suddenly get worse. MAKE SURE YOU:   Understand these instructions.  Will watch your  condition.  Will get help right away if you are not doing well or get worse. Document Released: 12/13/2001 Document Revised: 12/10/2011 Document Reviewed: 08/31/2011 [facility] Patient Information 2014 [place], [place]. Nausea and Vomiting Nausea is a sick feeling that often comes before throwing up (vomiting). Vomiting is a reflex where stomach contents come out of your mouth. Vomiting can cause severe loss of body fluids (dehydration). Children and elderly adults can become dehydrated quickly, especially if they also have diarrhea. Nausea and vomiting are symptoms of a condition or disease. It is important to find the cause of your symptoms. CAUSES   Direct irritation of the stomach lining. This irritation can result from increased acid production (gastroesophageal reflux disease), infection, food poisoning, taking certain medicines (such as nonsteroidal anti-inflammatory drugs), alcohol use, or tobacco use.  Signals from the brain.These signals could be caused by a headache, heat exposure, an inner ear disturbance, increased pressure in the brain from injury, infection, a tumor, or a concussion, pain, emotional stimulus, or metabolic problems.  An obstruction in the gastrointestinal tract (bowel obstruction).  Illnesses such as diabetes, hepatitis, gallbladder problems, appendicitis, kidney problems, cancer, sepsis, atypical symptoms of a heart attack, or eating disorders.  Medical treatments such as chemotherapy and radiation.  Receiving medicine that makes you sleep (general anesthetic) during surgery. DIAGNOSIS Your caregiver may ask for tests to be done if the problems do not improve after a few days. Tests may also be done if symptoms are severe or if the reason for the nausea and vomiting is not clear. Tests may include:  Urine tests.  Blood tests.  Stool tests.  Cultures (to look for evidence of infection).  X-rays or other imaging studies. Test results can help your caregiver  make decisions about treatment or the need for additional tests. TREATMENT You need to stay well hydrated. Drink frequently but in small amounts.You may wish to drink water, sports drinks, clear broth, or eat frozen ice pops or gelatin dessert to help stay hydrated.When you eat, eating slowly may help prevent nausea.There are also some antinausea medicines that may help prevent nausea. HOME CARE INSTRUCTIONS   Take all medicine as directed by your caregiver.  If you do not have an appetite, do not force yourself to eat. However, you must continue to drink fluids.  If you have an appetite, eat a normal diet unless your caregiver tells you differently.  Eat a variety of complex carbohydrates (rice, wheat, potatoes, bread), lean meats, yogurt, fruits, and vegetables.  Avoid high-fat foods because they are more difficult to digest.  Drink enough water and fluids to keep your urine clear or pale yellow.  If you are dehydrated, ask your caregiver for specific rehydration instructions. Signs of dehydration may include:  Severe thirst.  Dry lips and mouth.  Dizziness.  Dark urine.  Decreasing urine frequency and amount.  Confusion.  Rapid breathing or pulse. SEEK IMMEDIATE MEDICAL CARE IF:   You have blood or brown flecks (  like coffee grounds) in your vomit.  You have black or bloody stools.  You have a severe headache or stiff neck.  You are confused.  You have severe abdominal pain.  You have chest pain or trouble breathing.  You do not urinate at least once every 8 hours.  You develop cold or clammy skin.  You continue to vomit for longer than 24 to 48 hours.  You have a fever. MAKE SURE YOU:   Understand these instructions.  Will watch your condition.  Will get help right away if you are not doing well or get worse. Document Released: 12/23/2004 Document Revised: 03/17/2011 Document Reviewed: 05/22/2010 [facility] Patient Information 2014 [place], [place].

## 2014-12-03 ENCOUNTER — Ambulatory Visit: Payer: Self-pay

## 2014-12-04 ENCOUNTER — Ambulatory Visit (INDEPENDENT_AMBULATORY_CARE_PROVIDER_SITE_OTHER): Payer: Self-pay | Admitting: Emergency Medicine

## 2014-12-04 VITALS — BP 109/67 | HR 65 | Temp 98.1°F | Resp 16 | Ht 70.0 in | Wt 151.0 lb

## 2014-12-04 DIAGNOSIS — L03319 Cellulitis of trunk, unspecified: Secondary | ICD-10-CM

## 2014-12-04 DIAGNOSIS — E119 Type 2 diabetes mellitus without complications: Secondary | ICD-10-CM

## 2014-12-04 DIAGNOSIS — L02219 Cutaneous abscess of trunk, unspecified: Secondary | ICD-10-CM

## 2014-12-04 MED ORDER — METFORMIN HCL 500 MG PO TABS: 1000.0000 mg | ORAL_TABLET | Freq: Two times a day (BID) | ORAL | Status: DC

## 2014-12-04 MED ORDER — HYDROCODONE-ACETAMINOPHEN 5-325 MG PO TABS: 1.0000 | ORAL_TABLET | ORAL | Status: DC | PRN

## 2014-12-04 MED ORDER — GLIPIZIDE 5 MG PO TABS: 5.0000 mg | ORAL_TABLET | Freq: Every day | ORAL | Status: DC

## 2014-12-04 MED ORDER — SULFAMETHOXAZOLE-TRIMETHOPRIM 800-160 MG PO TABS: 1.0000 | ORAL_TABLET | Freq: Two times a day (BID) | ORAL | Status: DC

## 2014-12-04 NOTE — Progress Notes (Signed)
Subjective:  Patient ID: Hunter Villarreal, male    DOB: 28-Dec-1980  Age: 34 y.o. MRN: [id number]  CC: ingrown hair   HPI SHIRON SPRINGER presents  with a 1 week duration history of a painful tender mass in his left scrotum proximally near the inguinal crease. He has no history of injury. He thought initially it was a ingrown hair. Now he is got a abscess in size of a quarter. He has no fever chills no symptoms suggestive of an STD. He's had no improvement with over-the-counter medication he has a history of type 2 diabetes and has been off his medicine for about 2 months he has not had any medical any insurance and will have insurance in January  History Carthel has a past medical history of Diabetes mellitus without complication ([place]).   He has past surgical history that includes Repair knee ligament.   His  family history includes Diabetes in his brother.  He   reports that he has been smoking Cigarettes.  He has been smoking about 0.50 packs per day. He does not have any smokeless tobacco history on file. He reports that he drinks alcohol. He reports that he does not use illicit drugs.  Outpatient Prescriptions Prior to Visit  Medication Sig Dispense Refill  . ciprofloxacin (CIPRO) 500 MG tablet Take 1 tablet (500 mg total) by mouth 2 (two) times daily. 28 tablet 0  . glipiZIDE (GLUCOTROL) 5 MG tablet Take 5 mg by mouth daily before breakfast.    . HYDROcodone-acetaminophen (NORCO/VICODIN) 5-325 MG per tablet Take 1-2 tablets by mouth every 4 (four) hours as needed. 20 tablet 0  . metFORMIN (GLUCOPHAGE) 500 MG tablet Take 500 mg by mouth 2 (two) times daily with a meal.    . metroNIDAZOLE (FLAGYL) 500 MG tablet Take 1 tablet (500 mg total) by mouth 2 (two) times daily. 28 tablet 0  . ondansetron (ZOFRAN ODT) 4 MG disintegrating tablet Take 1 tablet (4 mg total) by mouth every 8 (eight) hours as needed for nausea or vomiting. 20 tablet 0   No facility-administered medications prior to  visit.    Social History   Social History  . Marital Status: Single    Spouse Name: N/A  . Number of Children: N/A  . Years of Education: N/A   Social History Main Topics  . Smoking status: Current Every Day Smoker -- 0.50 packs/day    Types: Cigarettes  . Smokeless tobacco: None  . Alcohol Use: Yes  . Drug Use: No  . Sexual Activity: Not Asked   Other Topics Concern  . None   Social History Narrative     Review of Systems  Constitutional: Negative for fever, chills and appetite change.  HENT: Negative for congestion, ear pain, postnasal drip, sinus pressure and sore throat.   Eyes: Negative for pain and redness.  Respiratory: Negative for cough, shortness of breath and wheezing.   Cardiovascular: Negative for leg swelling.  Gastrointestinal: Negative for nausea, vomiting, abdominal pain, diarrhea, constipation and blood in stool.  Endocrine: Negative for polyuria.  Genitourinary: Negative for dysuria, urgency, frequency and flank pain.  Musculoskeletal: Negative for gait problem.  Skin: Negative for rash.  Neurological: Negative for weakness and headaches.  Psychiatric/Behavioral: Negative for confusion and decreased concentration. The patient is not nervous/anxious.     Objective:  BP 109/67 mmHg  Pulse 65  Temp(Src) 98.1 F (36.7 C) (Oral)  Resp 16  Ht 5\' 10"  (1.778 m)  Wt 151 lb (68.493  kg)  BMI 21.67 kg/m2  SpO2 98%  Physical Exam  Constitutional: He is oriented to person, place, and time. He appears well-developed and well-nourished.  HENT:  Head: Normocephalic and atraumatic.  Eyes: Conjunctivae are normal. Pupils are equal, round, and reactive to light.  Pulmonary/Chest: Effort normal.  Musculoskeletal: He exhibits no edema.  Neurological: He is alert and oriented to person, place, and time.  Skin: Skin is dry.  Psychiatric: He has a normal mood and affect. His behavior is normal. Thought content normal.   He has a grape size abscess in the left  scrotum   Assessment & Plan:   Cajun was seen today for ingrown hair.  Diagnoses and all orders for this visit:  Cellulitis and abscess of trunk  Diabetes mellitus without complication ([place])  Other orders -     sulfamethoxazole-trimethoprim (BACTRIM DS,SEPTRA DS) 800-160 MG tablet; Take 1 tablet by mouth 2 (two) times daily. -     HYDROcodone-acetaminophen (NORCO) 5-325 MG tablet; Take 1-2 tablets by mouth every 4 (four) hours as needed. -     metFORMIN (GLUCOPHAGE) 500 MG tablet; Take 2 tablets (1,000 mg total) by mouth 2 (two) times daily with a meal. -     glipiZIDE (GLUCOTROL) 5 MG tablet; Take 1 tablet (5 mg total) by mouth daily before breakfast.   I have discontinued Mr. Sluka's ciprofloxacin, metroNIDAZOLE, HYDROcodone-acetaminophen, and ondansetron. I have also changed his metFORMIN and glipiZIDE. Additionally, I am having him start on sulfamethoxazole-trimethoprim and HYDROcodone-acetaminophen.  Meds ordered this encounter  Medications  . sulfamethoxazole-trimethoprim (BACTRIM DS,SEPTRA DS) 800-160 MG tablet    Sig: Take 1 tablet by mouth 2 (two) times daily.    Dispense:  20 tablet    Refill:  0  . HYDROcodone-acetaminophen (NORCO) 5-325 MG tablet    Sig: Take 1-2 tablets by mouth every 4 (four) hours as needed.    Dispense:  30 tablet    Refill:  0  . metFORMIN (GLUCOPHAGE) 500 MG tablet    Sig: Take 2 tablets (1,000 mg total) by mouth 2 (two) times daily with a meal.    Dispense:  180 tablet    Refill:  1  . glipiZIDE (GLUCOTROL) 5 MG tablet    Sig: Take 1 tablet (5 mg total) by mouth daily before breakfast.    Dispense:  90 tablet    Refill:  1    Appropriate red flag conditions were discussed with the patient as well as actions that should be taken.  Patient expressed his understanding.  Follow-up: Return in about 2 days (around 12/06/2014).  Roselee Culver, MD

## 2014-12-04 NOTE — Patient Instructions (Signed)

## 2014-12-04 NOTE — Progress Notes (Signed)
Risk and benefits discussed and verbal consent obtained. Anesthetic allergies reviewed. Patient anesthetized using 1:1 mix of 2% lidocaine with epi. A 1 cm incision was made using a number 11 blade and purulent material was expressed.  The was wound packed. The patient tolerated the procedure without difficulty.   A clean dressing was placed and wound care instructions were provided.  Philis Fendt, MS, PA-C 3:32 PM, 12/04/2014

## 2014-12-06 ENCOUNTER — Ambulatory Visit (INDEPENDENT_AMBULATORY_CARE_PROVIDER_SITE_OTHER): Payer: Self-pay | Admitting: Physician Assistant

## 2014-12-06 VITALS — BP 128/68 | HR 86 | Temp 98.3°F | Resp 14 | Ht 69.0 in | Wt 152.4 lb

## 2014-12-06 DIAGNOSIS — L03319 Cellulitis of trunk, unspecified: Secondary | ICD-10-CM

## 2014-12-06 DIAGNOSIS — L02219 Cutaneous abscess of trunk, unspecified: Secondary | ICD-10-CM

## 2014-12-06 NOTE — Progress Notes (Signed)
[facility] [address] [place] [address] [phone]  Date:  12/06/2014   Name:  Hunter Villarreal   DOB:  [birth year]   MRN:  [id number]  PCP:  No PCP Per Patient    Chief Complaint: Wound Check   History of Present Illness:  This is a 34 y.o. male with PMH DM2 who is presenting for wound care of his left groin. He underwent I&D on 11/28. He was placed on bactrim and tolerating well. He reports the pain is improved but still present. He is noticing a lot of drainage during the day. He denies fever, chills, malaise. He works for [employer] and is ready to return to work but wants note explaining he may not be at 100%.  Review of Systems:  Review of Systems See HPI  There are no active problems to display for this patient.   Prior to Admission medications   Medication Sig Start Date End Date Taking? Authorizing Provider  glipiZIDE (GLUCOTROL) 5 MG tablet Take 1 tablet (5 mg total) by mouth daily before breakfast. 12/04/14  Yes Roselee Culver, MD  HYDROcodone-acetaminophen (NORCO) 5-325 MG tablet Take 1-2 tablets by mouth every 4 (four) hours as needed. 12/04/14  Yes Roselee Culver, MD  metFORMIN (GLUCOPHAGE) 500 MG tablet Take 2 tablets (1,000 mg total) by mouth 2 (two) times daily with a meal. 12/04/14  Yes Roselee Culver, MD  sulfamethoxazole-trimethoprim (BACTRIM DS,SEPTRA DS) 800-160 MG tablet Take 1 tablet by mouth 2 (two) times daily. 12/04/14  Yes Roselee Culver, MD    No Known Allergies  Past Surgical History  Procedure Laterality Date  . Repair knee ligament      Social History  Substance Use Topics  . Smoking status: Current Every Day Smoker -- 0.50 packs/day    Types: Cigarettes  . Smokeless tobacco: None  . Alcohol Use: Yes    Family History  Problem Relation Age of Onset  . Diabetes Brother     Medication list has been reviewed and updated.  Physical Examination:  Physical Exam  Constitutional: He is oriented to  person, place, and time. He appears well-developed and well-nourished. No distress.  HENT:  Head: Normocephalic and atraumatic.  Right Ear: Hearing normal.  Left Ear: Hearing normal.  Nose: Nose normal.  Eyes: Conjunctivae and lids are normal. Right eye exhibits no discharge. Left eye exhibits no discharge. No scleral icterus.  Pulmonary/Chest: Effort normal. No respiratory distress.  Musculoskeletal: Normal range of motion.  Neurological: He is alert and oriented to person, place, and time.  Skin: Skin is warm and dry.  Dressing and packing removed.  Location: left scrotum Incision size: 0.5 cm Wound depth: 0.75 - 1 cm Wound irrigated with 3 cc 1% lidocaine without epi Minimal additional purulence. Very adherent necrotic tissue removed 1/4 inch packing loosely placed. Dressing replaced.   Psychiatric: He has a normal mood and affect. His speech is normal and behavior is normal. Thought content normal.    BP 128/68 mmHg  Pulse 86  Temp(Src) 98.3 F (36.8 C) (Oral)  Resp 14  Ht 5\' 9"  (1.753 m)  Wt 152 lb 6.4 oz (69.128 kg)  BMI 22.50 kg/m2  SpO2 98%  Assessment and Plan:  1. Cellulitis and abscess of trunk Minimal additional purulence. Packed loosely. Wound care discussed. Continue abx. Return in 48 hours for wound care.   Benjaman Pott Drenda Freeze, MHS [facility]  12/06/2014

## 2014-12-08 ENCOUNTER — Ambulatory Visit (INDEPENDENT_AMBULATORY_CARE_PROVIDER_SITE_OTHER): Payer: Self-pay | Admitting: Physician Assistant

## 2014-12-08 VITALS — BP 100/70 | HR 79 | Temp 97.5°F | Resp 16 | Ht 69.0 in | Wt 154.0 lb

## 2014-12-08 DIAGNOSIS — L02219 Cutaneous abscess of trunk, unspecified: Secondary | ICD-10-CM

## 2014-12-08 DIAGNOSIS — L03319 Cellulitis of trunk, unspecified: Secondary | ICD-10-CM

## 2014-12-08 NOTE — Progress Notes (Signed)
   12/08/2014 10:52 PM   DOB: March 31, 1980 / MRN: [id number]  SUBJECTIVE:  Hunter Villarreal is a 34 y.o. male presenting for wound recheck of the left scrotum.  Initial abscess managed with I&D on 11/28 and patient was rx'd antibiotic which he did not fill.  Here today for a wound recheck and reports that the packing fell out last night while in the shower.  Reports pain of the affected area.    He has No Known Allergies.   He  has a past medical history of Diabetes mellitus without complication ([place]).    He  reports that he has been smoking Cigarettes.  He has been smoking about 0.50 packs per day. He does not have any smokeless tobacco history on file. He reports that he drinks alcohol. He reports that he does not use illicit drugs. He  has no sexual activity history on file. The patient  has past surgical history that includes Repair knee ligament.  His family history includes Diabetes in his brother.  Review of Systems  Constitutional: Negative for fever.  Gastrointestinal: Negative for nausea.  Genitourinary: Negative.   Skin: Negative for itching.  Neurological: Negative for dizziness and headaches.    Problem list and medications reviewed and updated by myself where necessary, and exist elsewhere in the encounter.   OBJECTIVE:  BP 100/70 mmHg  Pulse 79  Temp(Src) 97.5 F (36.4 C) (Oral)  Resp 16  Ht 5\' 9"  (1.753 m)  Wt 154 lb (69.854 kg)  BMI 22.73 kg/m2  SpO2 97% CrCl cannot be calculated (Patient has no serum creatinine result on file.).  Physical Exam  Constitutional: He is oriented to person, place, and time. He appears well-developed and well-nourished. No distress.  HENT:  Head: Normocephalic and atraumatic.  Right Ear: Hearing normal.  Left Ear: Hearing normal.  Nose: Nose normal.  Eyes: Conjunctivae and lids are normal. Right eye exhibits no discharge. Left eye exhibits no discharge. No scleral icterus.  Pulmonary/Chest: Effort normal. No respiratory distress.    Musculoskeletal: Normal range of motion.  Neurological: He is alert and oriented to person, place, and time.  Skin: Skin is warm and dry.  Dressing and packing removed.  Location: left scrotum Incision size: 0.5 cm Wound depth: Clsed Minimal additional purulence  Psychiatric: He has a normal mood and affect. His speech is normal and behavior is normal. Thought content normal.    No results found for this or any previous visit (from the past 48 hour(s)).  ASSESSMENT AND PLAN  [name] was seen today for wound check.  Diagnoses and all orders for this visit:  Cellulitis and abscess of trunk: 34 y.o. male with a history of diabetes and recent incision and drainage.  The wound has closed since last night and he is having exquisite pain at the sight.  He has not filled the antibiotic.  I advised that he do this ASAP, as it appears that if he does not we may need to re-incise.  He will return in 48 hours if his symptoms are not improving with medication.     The patient was advised to call or return to clinic if he does not see an improvement in symptoms or to seek the care of the closest emergency department if he worsens with the above plan.   Philis Fendt, MHS, PA-C [facility] 12/08/2014 10:52 PM

## 2015-05-24 ENCOUNTER — Ambulatory Visit (INDEPENDENT_AMBULATORY_CARE_PROVIDER_SITE_OTHER): Payer: BLUE CROSS/BLUE SHIELD | Admitting: Physician Assistant

## 2015-05-24 VITALS — BP 114/68 | HR 78 | Temp 98.1°F | Resp 16 | Ht 70.0 in | Wt 152.0 lb

## 2015-05-24 DIAGNOSIS — IMO0002 Reserved for concepts with insufficient information to code with codable children: Secondary | ICD-10-CM

## 2015-05-24 DIAGNOSIS — M62830 Muscle spasm of back: Secondary | ICD-10-CM | POA: Diagnosis not present

## 2015-05-24 DIAGNOSIS — E118 Type 2 diabetes mellitus with unspecified complications: Secondary | ICD-10-CM | POA: Diagnosis not present

## 2015-05-24 DIAGNOSIS — E1165 Type 2 diabetes mellitus with hyperglycemia: Secondary | ICD-10-CM | POA: Diagnosis not present

## 2015-05-24 LAB — COMPLETE METABOLIC PANEL WITH GFR
ALK PHOS: 65 U/L (ref 40–115)
ALT: 22 U/L (ref 9–46)
AST: 16 U/L (ref 10–40)
Albumin: 4.2 g/dL (ref 3.6–5.1)
BUN: 20 mg/dL (ref 7–25)
CO2: 25 mmol/L (ref 20–31)
Calcium: 9.5 mg/dL (ref 8.6–10.3)
Chloride: 102 mmol/L (ref 98–110)
Creat: 1.24 mg/dL (ref 0.60–1.35)
GFR, EST NON AFRICAN AMERICAN: 74 mL/min (ref >=60)
GFR, Est African American: 86 mL/min (ref >=60)
GLUCOSE: 352 mg/dL — AB (ref 65–99)
POTASSIUM: 4.5 mmol/L (ref 3.5–5.3)
SODIUM: 136 mmol/L (ref 135–146)
Total Bilirubin: 0.6 mg/dL (ref 0.2–1.2)
Total Protein: 6.9 g/dL (ref 6.1–8.1)

## 2015-05-24 LAB — HEMOGLOBIN A1C
HEMOGLOBIN A1C: 13.5 % — AB (ref <5.7)
Mean Plasma Glucose: 341 mg/dL

## 2015-05-24 MED ORDER — CYCLOBENZAPRINE HCL 5 MG PO TABS: 5.0000 mg | ORAL_TABLET | Freq: Three times a day (TID) | ORAL | Status: DC | PRN

## 2015-05-24 MED ORDER — MELOXICAM 7.5 MG PO TABS: 7.5000 mg | ORAL_TABLET | Freq: Every day | ORAL | Status: DC

## 2015-05-24 NOTE — Progress Notes (Signed)
[facility] [address] [place] [address] [phone]  Date:  05/24/2015   Name:  Hunter Villarreal   DOB:  07-20-1980   MRN:  [id number]  PCP:  No PCP Per Patient   Chief Complaint  Patient presents with  . Back Pain    upper, x 2 days  . Shoulder Pain    right  . Medication Refill    metformin    History of Present Illness:  Hunter Villarreal is a 35 y.o. male patient who presents to [facility] for cc of diabetic recheck, medication refill, and shoulder pain.    Woke up with right sided shoulder pain and up back with pain 2 days ago.  The pain has then radiated across the shoulder blade.  Aggravated by driving, turning steering wheel.  He works at [employer], and backs trucks.  He was lifting heavy boxes that day.  No numbness or tingling.  Coughing may aggravate the pain.  Not aggravated by respirations.   Metformin: 2006, does not check blood sugar at home, 2 tablets by mouth twice per day.  He will miss his doses occasionally 2-3 days per week.  No adverse reactions.  No blurriness, nausea, diarrhea, frequency, chest pains, palpitations, sob, or dizziness.  ophtalmologist visit this year with glasses.  Eye doctor reported retinal damaged, though patient can not relay formal diagnosis.   No dietary restrictions eats desserts, sodas (diet), water (2-3 regular sized water bottles).       There are no active problems to display for this patient.   Past Medical History  Diagnosis Date  . Diabetes mellitus without complication [facility])     Past Surgical History  Procedure Laterality Date  . Repair knee ligament      Social History  Substance Use Topics  . Smoking status: Current Every Day Smoker -- 0.50 packs/day    Types: Cigarettes  . Smokeless tobacco: None  . Alcohol Use: Yes    Family History  Problem Relation Age of Onset  . Diabetes Brother     No Known Allergies  Medication list has been reviewed and updated.  Current Outpatient Prescriptions on  File Prior to Visit  Medication Sig Dispense Refill  . glipiZIDE (GLUCOTROL) 5 MG tablet Take 1 tablet (5 mg total) by mouth daily before breakfast. 90 tablet 1  . metFORMIN (GLUCOPHAGE) 500 MG tablet Take 2 tablets (1,000 mg total) by mouth 2 (two) times daily with a meal. 180 tablet 1  . HYDROcodone-acetaminophen (NORCO) 5-325 MG tablet Take 1-2 tablets by mouth every 4 (four) hours as needed. (Patient not taking: Reported on 05/24/2015) 30 tablet 0  . sulfamethoxazole-trimethoprim (BACTRIM DS,SEPTRA DS) 800-160 MG tablet Take 1 tablet by mouth 2 (two) times daily. (Patient not taking: Reported on 12/08/2014) 20 tablet 0   No current facility-administered medications on file prior to visit.    ROS ROS otherwise unremarkable unless listed above.   Physical Examination: BP 114/68 mmHg  Pulse 78  Temp(Src) 98.1 F (36.7 C)  Resp 16  Ht 5\' 10"  (1.778 m)  Wt 152 lb (68.947 kg)  BMI 21.81 kg/m2  SpO2 99% Ideal Body Weight: Weight in (lb) to have BMI = 25: 173.9  Physical Exam  Constitutional: He is oriented to person, place, and time. He appears well-developed and well-nourished. No distress.  HENT:  Head: Normocephalic and atraumatic.  Eyes: Conjunctivae and EOM are normal. Pupils are equal, round, and reactive to light.  Cardiovascular: Normal  rate, regular rhythm, normal heart sounds and intact distal pulses.  Exam reveals no friction rub.   No murmur heard. Pulmonary/Chest: Effort normal. No respiratory distress. He has no wheezes.  Abdominal: Soft. Bowel sounds are normal. He exhibits no distension and no mass. There is no tenderness.  Musculoskeletal:       Right shoulder: He exhibits normal range of motion, no tenderness, no swelling, normal pulse and normal strength.       Arms: Feet:  Right Foot:  Protective Sensation: 6 sites tested.6 sites sensed. Skin Integrity: Negative for ulcer, blister or skin breakdown.  Left Foot:  Protective Sensation: 6 sites tested. 6 sites  sensed. Skin Integrity: Negative for ulcer, blister or skin breakdown.  Neurological: He is alert and oriented to person, place, and time.  Skin: Skin is warm and dry. No rash noted. He is not diaphoretic. No erythema. No pallor.  Psychiatric: He has a normal mood and affect. His behavior is normal.     Assessment and Plan: Hunter Villarreal is a 35 y.o. male who is here today for cc of diabetic recheck, medication refill, and muscle pain. This appears to be muscle spasm.  Advised ice, stretches, muscle relaxant verbally and on avs.  I have advised better food choices, and have offered a dietician.  Complications of diabetes given to patient.      Will refill pending lab results.  rtc in 7-10 days if no improvement.   Uncontrolled type 2 diabetes mellitus with complication, without long-term current use of insulin ([place]) - Plan: COMPLETE METABOLIC PANEL WITH GFR, Hemoglobin A1c  Muscle spasm of back - Plan: meloxicam (MOBIC) 7.5 MG tablet, cyclobenzaprine (FLEXERIL) 5 MG tablet  Ivar Drape, PA-C [facility] 05/24/2015 10:17 AM

## 2015-05-24 NOTE — Patient Instructions (Addendum)
IF you received an x-ray today, you will receive an invoice from [facility]. Please contact [facility] at [phone] with questions or concerns regarding your invoice.   IF you received labwork today, you will receive an invoice from [employer]. Please contact [facility] at [phone] with questions or concerns regarding your invoice.   Our billing staff will not be able to assist you with questions regarding bills from these companies.  You will be contacted with the lab results as soon as they are available. The fastest way to get your results is to activate your My Chart account. Instructions are located on the last page of this paperwork. If you have not heard from [place] regarding the results in 2 weeks, please contact this office.   Do not take mobic with ibuprofen or naproxen.  You are able to do tylenol.   Perform the stretches three times per day.  Repeat the movement 6 times.  Hold for 10 seconds.  Heat prior to stretches.  Ice directly after.  Let me know if the pain does not improve after 7-10 days.   Flexeril is sedating, be careful.  Try 1 tablet at first.   Diabetes Mellitus and Food It is important for you to manage your blood sugar (glucose) level. Your blood glucose level can be greatly affected by what you eat. Eating healthier foods in the appropriate amounts throughout the day at about the same time each day will help you control your blood glucose level. It can also help slow or prevent worsening of your diabetes mellitus. Healthy eating may even help you improve the level of your blood pressure and reach or maintain a healthy weight.  General recommendations for healthful eating and cooking habits include:  Eating meals and snacks regularly. Avoid going long periods of time without eating to lose weight.  Eating a diet that consists mainly of plant-based foods, such as fruits, vegetables, nuts, legumes, and whole  grains.  Using low-heat cooking methods, such as baking, instead of high-heat cooking methods, such as deep frying. Work with your dietitian to make sure you understand how to use the Nutrition Facts information on food labels. HOW CAN FOOD AFFECT ME? Carbohydrates Carbohydrates affect your blood glucose level more than any other type of food. Your dietitian will help you determine how many carbohydrates to eat at each meal and teach you how to count carbohydrates. Counting carbohydrates is important to keep your blood glucose at a healthy level, especially if you are using insulin or taking certain medicines for diabetes mellitus. Alcohol Alcohol can cause sudden decreases in blood glucose (hypoglycemia), especially if you use insulin or take certain medicines for diabetes mellitus. Hypoglycemia can be a life-threatening condition. Symptoms of hypoglycemia (sleepiness, dizziness, and disorientation) are similar to symptoms of having too much alcohol.  If your health care provider has given you approval to drink alcohol, do so in moderation and use the following guidelines:  Women should not have more than one drink per day, and men should not have more than two drinks per day. One drink is equal to:  12 oz of beer.  5 oz of wine.  1 oz of hard liquor.  Do not drink on an empty stomach.  Keep yourself hydrated. Have water, diet soda, or unsweetened iced tea.  Regular soda, juice, and other mixers might contain a lot of carbohydrates and should be counted. WHAT FOODS ARE NOT RECOMMENDED? As you make food choices, it is  important to remember that all foods are not the same. Some foods have fewer nutrients per serving than other foods, even though they might have the same number of calories or carbohydrates. It is difficult to get your body what it needs when you eat foods with fewer nutrients. Examples of foods that you should avoid that are high in calories and carbohydrates but low in  nutrients include:  Trans fats (most processed foods list trans fats on the Nutrition Facts label).  Regular soda.  Juice.  Candy.  Sweets, such as cake, pie, doughnuts, and cookies.  Fried foods. WHAT FOODS CAN I EAT? Eat nutrient-rich foods, which will nourish your body and keep you healthy. The food you should eat also will depend on several factors, including:  The calories you need.  The medicines you take.  Your weight.  Your blood glucose level.  Your blood pressure level.  Your cholesterol level. You should eat a variety of foods, including:  Protein.  Lean cuts of meat.  Proteins low in saturated fats, such as fish, egg whites, and beans. Avoid processed meats.  Fruits and vegetables.  Fruits and vegetables that may help control blood glucose levels, such as apples, mangoes, and yams.  Dairy products.  Choose fat-free or low-fat dairy products, such as milk, yogurt, and cheese.  Grains, bread, pasta, and rice.  Choose whole grain products, such as multigrain bread, whole oats, and brown rice. These foods may help control blood pressure.  Fats.  Foods containing healthful fats, such as nuts, avocado, olive oil, canola oil, and fish. DOES EVERYONE WITH DIABETES MELLITUS HAVE THE SAME MEAL PLAN? Because every person with diabetes mellitus is different, there is not one meal plan that works for everyone. It is very important that you meet with a dietitian who will help you create a meal plan that is just right for you.   This information is not intended to replace advice given to you by your health care provider. Make sure you discuss any questions you have with your health care provider.   Document Released: 09/19/2004 Document Revised: 01/13/2014 Document Reviewed: 11/19/2012 Elsevier Interactive Patient Education 2016 [place] up and stretch properly before activity. Maintain physical fitness: Strength, flexibility, and  endurance. Cardiovascular fitness. PROGNOSIS  Trapezius palsy usually resolves spontaneously within 3 to 6 months. Nonsurgical (conservative) treatments may help decrease the severity of symptoms.  RELATED COMPLICATIONS  Permanent nerve damage, including pain, numbness, tingling, or weakness. Inability to compete at a high level. Recurrent symptoms that result in a chronic problem. Shoulder stiffness. TREATMENT Treatment initially involves resting from any activities that aggravate the symptoms, and the use of ice and medications to help reduce pain and inflammation. The use of strengthening and stretching exercises may help reduce pain with activity. These exercises may be performed at home or with referral to a therapist. A therapist may recommend further treatment, such as: The use of electric current to simulate the nerves (transcutaneous electronic nerve stimulation [TENS]). Ultrasound. If symptoms are severe, your caregiver may recommend you wear a brace to decrease discomfort. If symptoms persist for more than 6 months despite nonsurgical treatment, surgery may be recommended (uncommon). MEDICATION  If pain medication is necessary, then nonsteroidal anti-inflammatory medications, such as aspirin and ibuprofen, or other minor pain relievers, such as acetaminophen, are often recommended. Do not take pain medication for 7 days before surgery. Prescription pain relievers may be given if deemed necessary by your caregiver. Use only as  directed and only as much as you need. HEAT AND COLD Cold treatment (icing) relieves pain and reduces inflammation. Cold treatment should be applied for 10 to 15 minutes every 2 to 3 hours for inflammation and pain and immediately after any activity that aggravates your symptoms. Use ice packs or massage the area with a piece of ice (ice massage). Heat treatment may be used prior to performing the stretching and strengthening activities prescribed by your  caregiver, physical therapist, or athletic trainer. Use a heat pack or soak your injury in warm water. SEEK MEDICAL CARE IF: Treatment seems to offer no benefit, or the condition worsens. Any medications produce adverse side effects. EXERCISES RANGE OF MOTION (ROM) AND STRETCHING EXERCISES - Trapezius Palsy (Spinal Accessory Nerve Palsy) These exercises may help you when beginning to rehabilitate your injury. Your symptoms may resolve with or without further involvement from your physician, physical therapist or athletic trainer. While completing these exercises, remember:  Restoring tissue flexibility helps normal motion to return to the joints. This allows healthier, less painful movement and activity. An effective stretch should be held for at least 30 seconds. A stretch should never be painful. You should only feel a gentle lengthening or release in the stretched tissue. STRETCH - Flexion, Standing Stand with good posture. With an underhand grip on your right / left and an overhand grip on the opposite hand, grasp a broomstick or cane so that your hands are a little more than shoulder-width apart. Keeping your right / left elbow straight and shoulder muscles relaxed, push the stick with your opposite hand to raise your right / left arm in front of your body and then overhead. Raise your arm until you feel a stretch in your right / left shoulder, but before you have increased shoulder pain. Try to avoid shrugging your right / left shoulder as your arm rises by keeping your shoulder blade tucked down and toward your mid-back spine. Hold __________ seconds. Slowly return to the starting position. Repeat __________ times. Complete this exercise __________ times per day.  STRETCH - Abduction, Supine Stand with good posture. With an underhand grip on your right / left and an overhand grip on the opposite hand, grasp a broomstick or cane so that your hands are a little more than shoulder-width  apart. Keeping your right / left elbow straight and shoulder muscles relaxed, push the stick with your opposite hand to raise your right / left arm out to the side of your body and then overhead. Raise your arm until you feel a stretch in your right / left shoulder, but before you have increased shoulder pain. Try to avoid shrugging your right / left shoulder as your arm rises by keeping your shoulder blade tucked down and toward your mid-back spine. Hold __________ seconds. Slowly return to the starting position. Repeat __________ times. Complete this exercise __________ times per day.  ROM - Flexion, Active-Assisted Lie on your back. You may bend your knees for comfort. Grasp a broomstick or cane so your hands are about shoulder-width apart. Your right / left hand should grip the end of the stick/cane so that your hand is positioned "thumbs-up," as if you were about to shake hands. Using your healthy arm to lead, raise your right / left arm overhead until you feel a gentle stretch in your shoulder. Hold __________ seconds. Use the stick/cane to assist in returning your right / left arm to its starting position. Repeat __________ times. Complete this exercise __________ times per  day.  STRETCH - External Rotation and Abduction Stagger your stance through a doorframe. It does not matter which foot is forward. Choose one of the following positions as instructed by your physician, physical therapist or athletic trainer: place your hands: and forearms above your head and on the door frame. and forearms at head-height and on the door frame. at elbow-height and on the door frame. Keeping your head and chest upright and your stomach muscles tight to prevent over-extending your low-back, slowly shift your weight onto your front foot until you feel a stretch across your chest and/or in the front of your shoulders. Hold __________ seconds. Shift your weight to your back foot to release the stretch. Repeat  __________ times. Complete this stretch __________ times per day.  STRENGTHENING EXERCISES - Trapezius Palsy (Spinal Accessory Nerve Palsy) These exercises may help you when beginning to rehabilitate your injury. They may resolve your symptoms with or without further involvement from your physician, physical therapist or athletic trainer. While completing these exercises, remember:  Muscles can gain both the endurance and the strength needed for everyday activities through controlled exercises. Complete these exercises as instructed by your physician, physical therapist or athletic trainer. Progress with the resistance and repetition exercises only as your caregiver advises. You may experience muscle soreness or fatigue, but the pain or discomfort you are trying to eliminate should never worsen during these exercises. If this pain does worsen, stop and make certain you are following the directions exactly. If the pain is still present after adjustments, discontinue the exercise until you can discuss the trouble with your clinician. STRENGTH - Scapular Depression and Adduction With good posture, sit on a firm chair. Support your arms in front of you with pillows, arm rests or a table top. Have your elbows in line with the sides of your body. Gently draw your shoulder blades down and toward your mid-back spine. Gradually increase the tension without tensing the muscles along the top of your shoulders and the back of your neck. Hold for __________ seconds. Slowly release the tension and relax your muscles completely before completing the next repetition. After you have practiced this exercise, remove the arm support and complete it while standing as well as sitting. Repeat __________ times. Complete this exercise __________ times per day.  STRENGTH - Shoulder Abductors, Isometric  With good posture, stand or sit about 4-6 inches from a wall with your right / left side facing the wall. Bend your right /  left elbow. Gently press your right / left elbow into the wall. Increase the pressure gradually until you are pressing as hard as you can without shrugging your shoulder or increasing any shoulder discomfort. Hold __________ seconds. Release the tension slowly. Relax your shoulder muscles completely before you do the next repetition. Repeat __________ times. Complete this exercise __________ times per day.  STRENGTH - Shoulder Flexion, Isometric With good posture and facing a wall, stand or sit about 4-6 inches away. Keeping your right / left elbow straight, gently press the top of your fist into the wall. Increase the pressure gradually until you are pressing as hard as you can without shrugging your shoulder or increasing any shoulder discomfort. Hold __________ seconds. Release the tension slowly. Relax your shoulder muscles completely before you do the next repetition. Repeat __________ times. Complete this exercise __________ times per day.  STRENGTH - Internal Rotators Secure a rubber exercise band/tubing to a fixed object so that it is at the same height as your right /  left elbow when you are standing or sitting on a firm surface. Stand or sit so that the secured exercise band/tubing is at your right / left side. Bend your elbow 90 degrees. Place a folded towel or small pillow under your right / left arm so that your elbow is a few inches away from your side. Keeping the tension on the exercise band/tubing, pull it across your body toward your abdomen. Be sure to keep your body steady so that the movement is only coming from your shoulder rotating. Hold __________ seconds. Release the tension in a controlled manner as you return to the starting position. Repeat __________ times. Complete this exercise __________ times per day.  STRENGTH - External Rotators Secure a rubber exercise band/tubing to a fixed object so that it is at the same height as your right / left elbow when you are standing  or sitting on a firm surface. Stand or sit so that the secured exercise band/tubing is at your side that is not injured. Bend your elbow 90 degrees. Place a folded towel or small pillow under your right / left arm so that your elbow is a few inches away from your side. Keeping the tension on the exercise band/tubing, pull it away from your body, as if pivoting on your elbow. Be sure to keep your body steady so that the movement is only coming from your shoulder rotating. Hold __________ seconds. Release the tension in a controlled manner as you return to the starting position. Repeat __________ times. Complete this exercise __________ times per day.  STRENGTH - Shoulder Extensors Secure a rubber exercise band/tubing so that it is at the height of your shoulders when you are either standing or sitting on a firm arm-less chair. With a thumbs-up grip, grasp an end of the band/tubing in each hand. Straighten your elbows and lift your hands straight in front of you at shoulder height. Step back away from the secured end of band/tubing until it becomes tense. Squeezing your shoulder blades together, pull your hands down to the sides of your thighs. Do not allow your hands to go behind you. Hold for __________ seconds. Slowly ease the tension on the band/tubing as you reverse the directions and return to the starting position. Repeat __________ times. Complete this exercise __________ times per day.  STRENGTH - Shoulder Extensors, Prone Lie on your stomach on a firm surface so that your right / left arm overhangs the edge. Rest your forehead on your opposite forearm. With your thumb facing away from your body and your elbow straight, hold a __________ weight in your hand. Squeeze your right / left shoulder blade to your mid-back spine and then slowly raise your arm behind you to the height of the bed. Hold for __________ seconds. Slowly reverse the directions and return to the starting position, controlling  the weight as you lower your arm. Repeat __________ times. Complete this exercise __________ times per day.  STRENGTH - Horizontal Abductors Choose one of the two oppositions to complete this exercise. Prone (lying on stomach): Lie on your stomach on a firm surface so that your right / left arm overhangs the edge. Rest your forehead on your opposite forearm. With your palm facing the floor and your elbow straight, hold a __________ weight in your hand. Squeeze your right / left shoulder blade to your mid-back spine and then slowly raise your arm to the height of the bed. Hold for __________ seconds. Slowly reverse the directions and return to  the starting position, controlling the weight as you lower your arm. Repeat __________ times. Complete this exercise __________ times per day. Standing:  Secure a rubber exercise band/tubing so that it is at the height of your shoulders when you are either standing or sitting on a firm arm-less chair. Grasp an end of the band/tubing in each hand and have your palms face each other. Straighten your elbows and lift your hands straight in front of you at shoulder height. Step back away from the secured end of band/tubing until it becomes tense. Squeeze your shoulder blades together. Keeping your elbows locked and your hands at shoulder-height, bring your hands out to your side. Hold __________ seconds. Slowly ease the tension on the band/tubing as you reverse the directions and return to the starting position. Repeat __________ times. Complete this exercise __________ times per day. STRENGTH - Scapular Retractors and Elevators Secure a rubber exercise band/tubing so that it is at the height of your shoulders when you are either standing or sitting on a firm arm-less chair. With a thumbs-up grip, grasp an end of the band/tubing in each hand. Step back away from the secured end of band/tubing until it becomes tense. Squeezing your shoulder blades together,  straighten your elbows and lift your hands straight over your head. Hold for __________ seconds. Slowly ease the tension on the band/tubing as you reverse the directions and return to the starting position. Repeat __________ times. Complete this exercise __________ times per day.    This information is not intended to replace advice given to you by your health care provider. Make sure you discuss any questions you have with your health care provider.   Document Released: 12/23/2004 Document Revised: 05/09/2014 Document Reviewed: 04/06/2008 Elsevier Interactive Patient Education [employer].

## 2015-05-25 ENCOUNTER — Encounter: Payer: Self-pay | Admitting: *Deleted

## 2015-11-20 ENCOUNTER — Emergency Department (HOSPITAL_COMMUNITY): Payer: BLUE CROSS/BLUE SHIELD

## 2015-11-20 ENCOUNTER — Emergency Department (HOSPITAL_COMMUNITY)
Admission: EM | Admit: 2015-11-20 | Discharge: 2015-11-20 | Disposition: A | Discharge status: Home / Self Care | Payer: BLUE CROSS/BLUE SHIELD | Attending: Emergency Medicine | Admitting: Emergency Medicine

## 2015-11-20 ENCOUNTER — Ambulatory Visit (INDEPENDENT_AMBULATORY_CARE_PROVIDER_SITE_OTHER): Payer: BLUE CROSS/BLUE SHIELD | Admitting: Physician Assistant

## 2015-11-20 ENCOUNTER — Encounter (HOSPITAL_COMMUNITY): Payer: Self-pay | Admitting: Emergency Medicine

## 2015-11-20 VITALS — BP 110/70 | HR 76 | Temp 97.6°F | Ht 70.0 in | Wt 152.0 lb

## 2015-11-20 DIAGNOSIS — E1165 Type 2 diabetes mellitus with hyperglycemia: Secondary | ICD-10-CM | POA: Diagnosis not present

## 2015-11-20 DIAGNOSIS — R229 Localized swelling, mass and lump, unspecified: Secondary | ICD-10-CM | POA: Diagnosis not present

## 2015-11-20 DIAGNOSIS — R05 Cough: Secondary | ICD-10-CM

## 2015-11-20 DIAGNOSIS — R7309 Other abnormal glucose: Secondary | ICD-10-CM | POA: Diagnosis not present

## 2015-11-20 DIAGNOSIS — E1136 Type 2 diabetes mellitus with diabetic cataract: Secondary | ICD-10-CM

## 2015-11-20 DIAGNOSIS — F17210 Nicotine dependence, cigarettes, uncomplicated: Secondary | ICD-10-CM | POA: Diagnosis not present

## 2015-11-20 DIAGNOSIS — Z91199 Patient's noncompliance with other medical treatment and regimen due to unspecified reason: Secondary | ICD-10-CM

## 2015-11-20 DIAGNOSIS — IMO0002 Reserved for concepts with insufficient information to code with codable children: Secondary | ICD-10-CM

## 2015-11-20 DIAGNOSIS — R824 Acetonuria: Secondary | ICD-10-CM

## 2015-11-20 DIAGNOSIS — Z7984 Long term (current) use of oral hypoglycemic drugs: Secondary | ICD-10-CM | POA: Diagnosis not present

## 2015-11-20 DIAGNOSIS — Z9119 Patient's noncompliance with other medical treatment and regimen: Secondary | ICD-10-CM | POA: Insufficient documentation

## 2015-11-20 DIAGNOSIS — Z114 Encounter for screening for human immunodeficiency virus [HIV]: Secondary | ICD-10-CM | POA: Diagnosis not present

## 2015-11-20 DIAGNOSIS — R059 Cough, unspecified: Secondary | ICD-10-CM

## 2015-11-20 LAB — POC MICROSCOPIC URINALYSIS (UMFC): Mucus: ABSENT

## 2015-11-20 LAB — CBC WITH DIFFERENTIAL/PLATELET
BASOS ABS: 0 10*3/uL (ref 0.0–0.1)
Basophils Relative: 0 %
Eosinophils Absolute: 0.1 10*3/uL (ref 0.0–0.7)
Eosinophils Relative: 3 %
HCT: 36.8 % — ABNORMAL LOW (ref 39.0–52.0)
HEMOGLOBIN: 13.2 g/dL (ref 13.0–17.0)
LYMPHS ABS: 1.4 10*3/uL (ref 0.7–4.0)
LYMPHS PCT: 27 %
MCH: 29.5 pg (ref 26.0–34.0)
MCHC: 35.9 g/dL (ref 30.0–36.0)
MCV: 82.3 fL (ref 78.0–100.0)
Monocytes Absolute: 0.4 10*3/uL (ref 0.1–1.0)
Monocytes Relative: 7 %
NEUTROS ABS: 3.4 10*3/uL (ref 1.7–7.7)
NEUTROS PCT: 63 %
Platelets: 201 10*3/uL (ref 150–400)
RBC: 4.47 MIL/uL (ref 4.22–5.81)
RDW: 11.6 % (ref 11.5–15.5)
WBC: 5.3 10*3/uL (ref 4.0–10.5)

## 2015-11-20 LAB — POCT URINALYSIS DIP (MANUAL ENTRY)
Leukocytes, UA: NEGATIVE
Nitrite, UA: NEGATIVE
RBC UA: NEGATIVE
SPEC GRAV UA: 1.025
Urobilinogen, UA: 0.2
pH, UA: 5.5

## 2015-11-20 LAB — BLOOD GAS, VENOUS
ACID-BASE EXCESS: 1 mmol/L (ref 0.0–2.0)
Bicarbonate: 27.7 mmol/L (ref 20.0–28.0)
FIO2: 21
O2 Saturation: 50.6 %
PATIENT TEMPERATURE: 98.6
PH VEN: 7.321 (ref 7.250–7.430)
pCO2, Ven: 55.2 mmHg (ref 44.0–60.0)

## 2015-11-20 LAB — BASIC METABOLIC PANEL
ANION GAP: 6 (ref 5–15)
BUN: 27 mg/dL — AB (ref 6–20)
CHLORIDE: 103 mmol/L (ref 101–111)
CO2: 29 mmol/L (ref 22–32)
Calcium: 9.1 mg/dL (ref 8.9–10.3)
Creatinine, Ser: 1.22 mg/dL (ref 0.61–1.24)
GFR calc non Af Amer: >60 mL/min (ref >60)
Glucose, Bld: 218 mg/dL — ABNORMAL HIGH (ref 65–99)
POTASSIUM: 4.3 mmol/L (ref 3.5–5.1)
SODIUM: 138 mmol/L (ref 135–145)

## 2015-11-20 LAB — CBG MONITORING, ED: Glucose-Capillary: 201 mg/dL — ABNORMAL HIGH (ref 65–99)

## 2015-11-20 LAB — GLUCOSE, POCT (MANUAL RESULT ENTRY): POC GLUCOSE: 233 mg/dL — AB (ref 70–99)

## 2015-11-20 LAB — PHOSPHORUS: Phosphorus: 3.5 mg/dL (ref 2.5–4.6)

## 2015-11-20 LAB — MAGNESIUM: Magnesium: 1.8 mg/dL (ref 1.7–2.4)

## 2015-11-20 LAB — POCT GLYCOSYLATED HEMOGLOBIN (HGB A1C)

## 2015-11-20 MED ORDER — DOXYCYCLINE HYCLATE 100 MG PO CAPS: 100.0000 mg | ORAL_CAPSULE | Freq: Two times a day (BID) | ORAL | 0 refills | Status: DC

## 2015-11-20 MED ORDER — METFORMIN HCL 1000 MG PO TABS: 1000.0000 mg | ORAL_TABLET | Freq: Two times a day (BID) | ORAL | 0 refills | Status: DC

## 2015-11-20 MED ORDER — POTASSIUM CHLORIDE CRYS ER 20 MEQ PO TBCR: 40.0000 meq | EXTENDED_RELEASE_TABLET | Freq: Once | ORAL | Status: DC

## 2015-11-20 MED ORDER — SODIUM CHLORIDE 0.9 % IV BOLUS (SEPSIS)
1000.0000 mL | Freq: Once | INTRAVENOUS | Status: AC
  Administered 2015-11-20: 1000 mL via INTRAVENOUS

## 2015-11-20 MED ORDER — GLIPIZIDE 5 MG PO TABS: 5.0000 mg | ORAL_TABLET | Freq: Every day | ORAL | 0 refills | Status: DC

## 2015-11-20 NOTE — ED Provider Notes (Signed)
[place] DEPT Provider Note   CSN: [id number] Arrival date & time: 11/20/15  1845     History   Chief Complaint Chief Complaint  Patient presents with  . sent from Urgent Care for ? DKA    HPI Hunter Villarreal is a 35 y.o. male with a PMHx of DM2, who presents to the ED sent in from [facility] for evaluation of ?DKA. Chart review reveals that he saw [place] PA-C at [facility] today for recheck of his diabetes, labs showed: Hgb A1C >14.0, CBG 233, and U/A with +glucose (500), +ketones (>/= 160), and +trace proteins. Per notes, he's supposed to be on metformin 1000mg  BID and glipizide 5mg  qAM. Patient is able to acknowledge this history as being correct. He admits that he ran out of his diabetes medicines 2 weeks ago, and wasn't taking them consistently, would miss several days even weeks at a time and wasn't very compliant. He was diagnosed as being DM2 at age 23. He has multiple family members (MGF/MGM/Aunts/Uncles/Brother) that have type 2 diabetes, only one family member uses insulin but he denies that any family members that have juvenile DM1 or History of DKA. He states that overall he feels well, occasionally notices that he has some polydipsia reporting that he drinks multiple Mountain Dew's per day but not always, and has noticed some intermittent peripheral neuropathy paresthesias in his hands and feet. He's had a cough with clear sputum production x1 wk which he states is improving. Otherwise, he denies any complaints or concerns at this time, including denying any fevers, chills, congestion or other URI symptoms, chest pain, shortness breath, vision changes, lightheadedness, abdominal pain, nausea, vomiting, diarrhea, constipation, dysuria, hematuria, numbness, focal weakness, or polyuria.    The history is provided by the patient and medical records. No language interpreter was used.  Hyperglycemia  Blood sugar level PTA:  233 Severity:  Mild Onset quality:  Unable to  specify Timing:  Unable to specify Progression:  Unable to specify Chronicity:  Recurrent Diabetes status:  Controlled with oral medications Current diabetic therapy:  Metformin 1000mg  BID and glipizide 5mg  qAM Time since last antidiabetic medication:  2 weeks Context: noncompliance   Relieved by:  None tried Ineffective treatments:  None tried Associated symptoms: increased thirst (intermittently)   Associated symptoms: no abdominal pain, no chest pain, no confusion, no dysuria, no fever, no nausea, no polyuria, no shortness of breath, no vomiting and no weakness   Risk factors: family hx of diabetes   Risk factors: no hx of DKA     Past Medical History:  Diagnosis Date  . Diabetes mellitus without complication ([place])     There are no active problems to display for this patient.   Past Surgical History:  Procedure Laterality Date  . REPAIR KNEE LIGAMENT         Home Medications    Prior to Admission medications   Medication Sig Start Date End Date Taking? Authorizing Provider  cyclobenzaprine (FLEXERIL) 5 MG tablet Take 1 tablet (5 mg total) by mouth 3 (three) times daily as needed for muscle spasms. Patient not taking: Reported on 11/20/2015 05/24/15   Dorian Heckle English, PA  doxycycline (VIBRAMYCIN) 100 MG capsule Take 1 capsule (100 mg total) by mouth 2 (two) times daily. 11/20/15   Leonie Douglas, PA-C  glipiZIDE (GLUCOTROL) 5 MG tablet Take 1 tablet (5 mg total) by mouth daily before breakfast. 12/04/14   Roselee Culver, MD  HYDROcodone-acetaminophen (NORCO) 5-325 MG tablet  Take 1-2 tablets by mouth every 4 (four) hours as needed. Patient not taking: Reported on 11/20/2015 12/04/14   Roselee Culver, MD  meloxicam (MOBIC) 7.5 MG tablet Take 1 tablet (7.5 mg total) by mouth daily. Patient not taking: Reported on 11/20/2015 05/24/15   Dorian Heckle English, PA  metFORMIN (GLUCOPHAGE) 500 MG tablet Take 2 tablets (1,000 mg total) by mouth 2 (two) times daily with  a meal. 12/04/14   Roselee Culver, MD    Family History Family History  Problem Relation Age of Onset  . Diabetes Brother     Social History Social History  Substance Use Topics  . Smoking status: Current Every Day Smoker    Packs/day: 0.50    Types: Cigarettes  . Smokeless tobacco: Never Used  . Alcohol use Yes     Allergies   Patient has no known allergies.   Review of Systems Review of Systems  Constitutional: Negative for chills and fever.  HENT: Negative for congestion.   Eyes: Negative for visual disturbance.  Respiratory: Positive for cough (1wk, improving, clear sputum production). Negative for shortness of breath.   Cardiovascular: Negative for chest pain.  Gastrointestinal: Negative for abdominal pain, constipation, diarrhea, nausea and vomiting.  Endocrine: Positive for polydipsia (intermittently). Negative for polyuria.  Genitourinary: Negative for dysuria and hematuria.  Musculoskeletal: Negative for arthralgias and myalgias.  Skin: Negative for color change.  Allergic/Immunologic: Positive for immunocompromised state (diabetic).  Neurological: Negative for weakness, light-headedness and numbness.       +intermittent paresthesias in feet and hands  Psychiatric/Behavioral: Negative for confusion.   10 Systems reviewed and are negative for acute change except as noted in the HPI.   Physical Exam Updated Vital Signs BP 121/96 (BP Location: Right Arm)   Pulse 84   Temp 98.7 F (37.1 C) (Oral)   Resp 17   Ht 5' 10.5" (1.791 m)   Wt 68.9 kg   SpO2 100%   BMI 21.50 kg/m   Physical Exam  Constitutional: He is oriented to person, place, and time. Vital signs are normal. He appears well-developed and well-nourished.  Non-toxic appearance. No distress.  Afebrile, nontoxic, NAD  HENT:  Head: Normocephalic and atraumatic.  Mouth/Throat: Oropharynx is clear and moist and mucous membranes are normal.  Eyes: Conjunctivae and EOM are normal. Right eye  exhibits no discharge. Left eye exhibits no discharge.  Neck: Normal range of motion. Neck supple.  Cardiovascular: Normal rate, regular rhythm, normal heart sounds and intact distal pulses.  Exam reveals no gallop and no friction rub.   No murmur heard. Pulmonary/Chest: Effort normal and breath sounds normal. No respiratory distress. He has no decreased breath sounds. He has no wheezes. He has no rhonchi. He has no rales.  CTAB in all lung fields, no w/r/r, no hypoxia or increased WOB, speaking in full sentences, SpO2 100% on RA  No kussmaul respirations, no ketotic breath  Abdominal: Soft. Normal appearance and bowel sounds are normal. He exhibits no distension. There is no tenderness. There is no rigidity, no rebound, no guarding, no CVA tenderness, no tenderness at McBurney's point and negative Murphy's sign.  Musculoskeletal: Normal range of motion.  Neurological: He is alert and oriented to person, place, and time. He has normal strength. No sensory deficit.  Skin: Skin is warm, dry and intact. No rash noted.  Psychiatric: He has a normal mood and affect.  Nursing note and vitals reviewed.    ED Treatments / Results  Labs (all labs ordered  are listed, but only abnormal results are displayed) Labs Reviewed  BASIC METABOLIC PANEL - Abnormal; Notable for the following:       Result Value   Glucose, Bld 218 (*)    BUN 27 (*)    All other components within normal limits  CBC WITH DIFFERENTIAL/PLATELET - Abnormal; Notable for the following:    HCT 36.8 (*)    All other components within normal limits  CBG MONITORING, ED - Abnormal; Notable for the following:    Glucose-Capillary 201 (*)    All other components within normal limits  MAGNESIUM  PHOSPHORUS  BLOOD GAS, VENOUS    EKG  EKG Interpretation None       Radiology Dg Chest 2 View  Result Date: 11/20/2015 CLINICAL DATA:  Cough x2 week.  Diabetes.  Half pack per day smoker. EXAM: CHEST  2 VIEW COMPARISON:  None.  FINDINGS: The heart size and mediastinal contours are within normal limits. Both lungs are clear. The visualized skeletal structures are unremarkable. IMPRESSION: No active cardiopulmonary disease. Electronically Signed   By: Ashley Royalty M.D.   On: 11/20/2015 19:23    Procedures Procedures (including critical care time)  Medications Ordered in ED Medications  sodium chloride 0.9 % bolus 1,000 mL (1,000 mLs Intravenous New Bag/Given 11/20/15 1954)     Initial Impression / Assessment and Plan / ED Course  I have reviewed the triage vital signs and the nursing notes.  Pertinent labs & imaging results that were available during my care of the patient were reviewed by me and considered in my medical decision making (see chart for details).  Clinical Course     35 y.o. male sent here from [facility] for eval of ?DKA. Labs there showed CBG 233, U/A with +ketones +protein +gluc, A1C >14.0. He admits he wasn't very compliant with his metformin and glipizide and misses doses often, and then also admits that he ran out 2wks ago. No FHx of DM1, but multiple members are type-2 diabetic. Only complaints are: cough x1 wk with clear sputum production, gradually improving; polydipsia; and intermittent periph neuropathy symptoms. Otherwise he feels fine. Benign abd exam, no kussmaul respirations or ketotic breath, well appearing male; lung sounds clear. Will get CBC w/diff, BMP, Mg, Phos, VBG, and U/A, as well as CXR. Will give fluids. Likely not in DKA/HHS, but will reassess after labs.   8:30 PM CBC w/diff unremarkable. BMP with gluc 218, no abnormal bicarb, no anion gap. Mg and Phos WNL. VBG without acidosis. CXR unremarkable. Given that no signs of DKA exist on labs, and pt well appearing, doubt need for U/A given that it was already done at urgent care earlier. Discussed that he needs to get back on metformin and glipizide, doubt need for starting insulin he just needs to get back on track to get his DM2  under control; diet modifications discussed to help control diabetes; OTC meds for cough discussed; f/up with PCP in 1wk for ongoing management of diabetes. I explained the diagnosis and have given explicit precautions to return to the ER including for any other new or worsening symptoms. The patient understands and accepts the medical plan as it's been dictated and I have answered their questions. Discharge instructions concerning home care and prescriptions have been given. The patient is STABLE and is discharged to home in good condition.   Final Clinical Impressions(s) / ED Diagnoses   Final diagnoses:  Type 2 diabetes mellitus with hyperglycemia, without long-term current use of insulin (  [place])  Noncompliance with diabetes treatment  Cough    New Prescriptions New Prescriptions   GLIPIZIDE (GLUCOTROL) 5 MG TABLET    Take 1 tablet (5 mg total) by mouth daily before breakfast.   METFORMIN (GLUCOPHAGE) 1000 MG TABLET    Take 1 tablet (1,000 mg total) by mouth 2 (two) times daily.     Dewitt Hoes Camprubi-Soms, PA-C 11/20/15 2039    Dorie Rank, MD 11/22/15 [phone]

## 2015-11-20 NOTE — ED Triage Notes (Signed)
Patient was sent to ED for further evaluation for possible DKA from Urgent Care after his Hgb A1c was so high it was unreadable and urinalysis per patient.  Patient denies taking any insulin and states that he was diagnosed with Type 2 DM and urgent care thinks that reason his glucose is so out of control and think he was missed diagnosed.

## 2015-11-20 NOTE — Discharge Instructions (Signed)
Continue taking your diabetes medications as directed, and don't miss any doses. Stay well hydrated with WATER. Eat a low-carbohydrate diet and avoid sugary foods or beverages. Use over the counter cough medications and/or antihistamines to help with cough. Follow up with your doctor in 1 week for recheck of symptoms and ongoing management of your diabetes. Return to the ER for changes or worsening symptoms.

## 2015-11-20 NOTE — ED Notes (Signed)
Per UC-uncontrolled DM-sent here for possible DKA

## 2015-11-20 NOTE — Patient Instructions (Addendum)
  Go immediately to [facility]. We have contacted the charge nurse at the ER.   Follow up with me when you get out of the hospital and we can further evaluate your skin lesion.   I have also put in a referral to endocrinology for you.     IF you received an x-ray today, you will receive an invoice from [facility]. Please contact [facility] at [phone] with questions or concerns regarding your invoice.   IF you received labwork today, you will receive an invoice from [employer]. Please contact [facility] at [phone] with questions or concerns regarding your invoice.   Our billing staff will not be able to assist you with questions regarding bills from these companies.  You will be contacted with the lab results as soon as they are available. The fastest way to get your results is to activate your My Chart account. Instructions are located on the last page of this paperwork. If you have not heard from [place] regarding the results in 2 weeks, please contact this office.

## 2015-11-20 NOTE — Progress Notes (Signed)
MRN: [id number]  Subjective:   Hunter Villarreal is a 35 y.o. male who presents for follow up of Type 2 diabetes mellitus.   Diagnosis was made in 2006. Patient is currently managed with metformin 1000mg  BID and glipizide 5mg  daily. Admits poor compliance. Denies adverse effects including metallic taste, hypoglycemia, nausea, and vomiting. He has been out of both medications for the past week but he was seen for his last visit here in 05/2015 and given only three months worth of prescription and has only been taking it sporadically not daily.   Patient is not checking home blood sugars. . Current symptoms include polydipsia, polyuria, visual disturbances and weight loss. Patient denies foot ulcerations, increased appetite, nausea, paresthesia of the feet and vomiting. Patient is checking their feet daily. No foot concerns. Last diabetic eye exam eye exam 11/19/15. The retina specialist told him there was minor swelling of retinas and that he needed cataract surgery. He started wearing glasses 9 months ago.   Diet: he likes sandwiches, chips, dip, red meat, white meat, pastas, chocolates, salted peanuts, and cheetos. He has limited his cookie intake. He likes to drink diet sunkist and diet mt.dew and diet green tea. He only drinks water occasionally. Exercise includes none.   Known diabetic complications: Visual disturbance.   Immunizations: Flu vaccine: has not had it this year, pneumococal vaccine: has never had one. However he notes he is feeling a little congested today so he does not want to receive them today.   His last visit was 05/24/15 and his A1C was 13.5. He was not taking any medication at this time because he did not have any insurance.      Past Medical History:  Diagnosis Date  . Diabetes mellitus without complication ([place])    Family History  Problem Relation Age of Onset  . Diabetes Brother     Objective:   PHYSICAL EXAM BP 110/70 (BP Location: Right Arm, Patient  Position: Sitting, Cuff Size: Small)   Pulse 76   Temp 97.6 F (36.4 C) (Oral)   Ht 5\' 10"  (1.778 m)   Wt 152 lb (68.9 kg)   SpO2 99%   BMI 21.81 kg/m   Physical Exam  Constitutional: He is oriented to person, place, and time. He appears well-developed and well-nourished.  HENT:  Head: Normocephalic and atraumatic.  Right Ear: External ear normal. There is drainage (cerumen blocking view of TM).  Left Ear: Tympanic membrane, external ear and ear canal normal.  Mouth/Throat: Uvula is midline, oropharynx is clear and moist and mucous membranes are normal.  Eyes: Conjunctivae are normal. Pupils are equal, round, and reactive to light.  Neck: Normal range of motion.  Cardiovascular: Normal rate, regular rhythm, normal heart sounds and intact distal pulses.   Pulmonary/Chest: Effort normal and breath sounds normal.  Abdominal: Soft. Bowel sounds are normal. There is no tenderness.  Neurological: He is alert and oriented to person, place, and time.  Skin: Skin is warm and dry.     Psychiatric: He has a normal mood and affect.  Vitals reviewed.     Diabetic Foot Exam - Simple   Simple Foot Form Visual Inspection No deformities, no ulcerations, no other skin breakdown bilaterally:  Yes Sensation Testing Intact to touch and monofilament testing bilaterally:  Yes Pulse Check Posterior Tibialis and Dorsalis pulse intact bilaterally:  Yes Comments     Results for orders placed or performed in visit on 11/20/15 (from the past 24 hour(s))  POCT glycosylated hemoglobin (  Hb A1C)     Status: None   Collection Time: 11/20/15  5:36 PM  Result Value Ref Range   Hemoglobin A1C >=14.0   POCT urinalysis dipstick     Status: Abnormal   Collection Time: 11/20/15  5:36 PM  Result Value Ref Range   Color, UA yellow yellow   Clarity, UA clear clear   Glucose, UA =500 (A) negative   Bilirubin, UA small (A) negative   Ketones, POC UA >= (160) (A) negative   Spec Grav, UA 1.025    Blood, UA  negative negative   pH, UA 5.5    Protein Ur, POC trace (A) negative   Urobilinogen, UA 0.2    Nitrite, UA Negative Negative   Leukocytes, UA Negative Negative  POCT Microscopic Urinalysis ([facility])     Status: Abnormal   Collection Time: 11/20/15  5:37 PM  Result Value Ref Range   WBC,UR,HPF,POC None None WBC/hpf   RBC,UR,HPF,POC None None RBC/hpf   Bacteria None None, Too numerous to count   Mucus Absent Absent   Epithelial Cells, UR Per Microscopy Few (A) None, Too numerous to count cells/hpf  POCT glucose (manual entry)     Status: Abnormal   Collection Time: 11/20/15  5:42 PM  Result Value Ref Range   POC Glucose 233 (A) 70 - 99 mg/dl   Assessment and Plan :  This case was precepted with Dr. Carlota Raspberry.   -Due to ketonuria, A1C>14 and in house glucose of 233, pt instructed to go to ER immediately. [facility] charge nurse, Marzetta Board, was contacted and given pt's demographics and lab results from our office. Pt is accompanied by a friend and she agrees to take him there immediately.  1. Uncontrolled type 2 diabetes mellitus with hyperglycemia, without long-term current use of insulin ([facility]) - POCT glycosylated hemoglobin (Hb A1C) - POCT urinalysis dipstick - POCT Microscopic Urinalysis ([facility]) - COMPLETE METABOLIC PANEL WITH GFR - Lipid panel - Microalbumin, urine - Ambulatory referral to diabetic education - HM Diabetes Foot Exam - POCT glucose (manual entry) - Ambulatory referral to Endocrinology -Pt was not given refills for medication because he was sent directly to ER after lab results. He will follow up in our office later this week once discharged from hospital.   2. Ketonuria 3. Elevated glucose  4. Encounter for screening for HIV - HIV antibody  5. Mass -Likely an infected cyst or abscess, pt instructed to follow up once out of the hospital for further evaluation and possible I&D of the mass.  - doxycycline (VIBRAMYCIN) 100 MG capsule; Take 1 capsule (100 mg total) by  mouth 2 (two) times daily.  Dispense: 20 capsule; Refill: 0  Tenna Delaine, PA-C  [facility] 11/20/2015 7:34 PM

## 2015-11-21 LAB — COMPLETE METABOLIC PANEL WITH GFR
ALBUMIN: 4.1 g/dL (ref 3.6–5.1)
ALK PHOS: 53 U/L (ref 40–115)
ALT: 16 U/L (ref 9–46)
AST: 17 U/L (ref 10–40)
BUN: 23 mg/dL (ref 7–25)
CALCIUM: 9.1 mg/dL (ref 8.6–10.3)
CO2: 28 mmol/L (ref 20–31)
CREATININE: 1.22 mg/dL (ref 0.60–1.35)
Chloride: 103 mmol/L (ref 98–110)
GFR, EST AFRICAN AMERICAN: 88 mL/min (ref >=60)
GFR, Est Non African American: 76 mL/min (ref >=60)
Glucose, Bld: 243 mg/dL — ABNORMAL HIGH (ref 65–99)
Potassium: 4.4 mmol/L (ref 3.5–5.3)
Sodium: 137 mmol/L (ref 135–146)
Total Bilirubin: 0.5 mg/dL (ref 0.2–1.2)
Total Protein: 6.7 g/dL (ref 6.1–8.1)

## 2015-11-21 LAB — LIPID PANEL
CHOLESTEROL: 184 mg/dL (ref <200)
HDL: 49 mg/dL (ref >40)
LDL Cholesterol: 118 mg/dL — ABNORMAL HIGH (ref <100)
TRIGLYCERIDES: 84 mg/dL (ref <150)
Total CHOL/HDL Ratio: 3.8 Ratio (ref <5.0)
VLDL: 17 mg/dL (ref <30)

## 2015-11-21 LAB — HIV ANTIBODY (ROUTINE TESTING W REFLEX): HIV: NONREACTIVE

## 2015-11-21 LAB — MICROALBUMIN, URINE: Microalb, Ur: 3.2 mg/dL

## 2015-11-24 ENCOUNTER — Ambulatory Visit (INDEPENDENT_AMBULATORY_CARE_PROVIDER_SITE_OTHER): Payer: BLUE CROSS/BLUE SHIELD | Admitting: Physician Assistant

## 2015-11-24 VITALS — BP 122/72 | HR 74 | Temp 97.7°F | Resp 17 | Ht 71.0 in | Wt 160.0 lb

## 2015-11-24 DIAGNOSIS — L02219 Cutaneous abscess of trunk, unspecified: Secondary | ICD-10-CM

## 2015-11-24 MED ORDER — BLOOD GLUCOSE MONITORING SUPPL W/DEVICE KIT: 1.0000 [IU] | PACK | Freq: Every day | 0 refills | Status: DC

## 2015-11-24 NOTE — Patient Instructions (Addendum)
  WOUND CARE Please return in 2 days to have your packing removed or sooner if you have concerns. Apply warm compress to affected area to allow for any remaining pus to drain. Marland Kitchen Keep area clean and dry for 24 hours. Do not remove bandage, if applied. . After 24 hours, you can get bandage wet in the shower. Reapply a new bandage when it becomes soaked. . Notify the office if you experience any of the following signs of infection: Swelling, redness, pus drainage, streaking, fever >101.0 F . Notify the office if you experience excessive bleeding that does not stop after 15-20 minutes of constant, firm pressure.   Continue antibiotics as prescribed.     IF you received an x-ray today, you will receive an invoice from [facility]. Please contact [facility] at [phone] with questions or concerns regarding your invoice.   IF you received labwork today, you will receive an invoice from [employer]. Please contact [facility] at [phone] with questions or concerns regarding your invoice.   Our billing staff will not be able to assist you with questions regarding bills from these companies.  You will be contacted with the lab results as soon as they are available. The fastest way to get your results is to activate your My Chart account. Instructions are located on the last page of this paperwork. If you have not heard from [place] regarding the results in 2 weeks, please contact this office.

## 2015-11-24 NOTE — Progress Notes (Signed)
    MRN: [id number] DOB: [birth year]  Subjective:   Hunter Villarreal is a 35 y.o. male presenting for chief complaint of boil (on side left ) Was seen on 11/20/15 for diabetes medication refill but mentioned he had a boil on his left side at this time. He was sent to the ER due to his elevated blood sugar and given a prescription for doxycycline for the infected boil and told to return once he was out of the hospital.   Notes the boil has been there for one week. It was growing in size but now it has stayed about the same size. Has associated pain, redness. Denies warmth,  purulent discharge, fever, chills, and diaphoresis. No history of MRSA.   Hunter Villarreal has a current medication list which includes the following prescription(s): doxycycline, glipizide, glipizide, metformin, and metformin. Also has No Known Allergies.  Hunter Villarreal  has a past medical history of Diabetes mellitus without complication ([place]). Also  has a past surgical history that includes Repair knee ligament.   Objective:   Vitals: BP 122/72 (BP Location: Right Arm, Patient Position: Sitting, Cuff Size: Normal)   Pulse 74   Temp 97.7 F (36.5 C) (Oral)   Resp 17   Ht 5\' 11"  (1.803 m)   Wt 160 lb (72.6 kg)   SpO2 98%   BMI 22.32 kg/m   Physical Exam  Constitutional: He is oriented to person, place, and time. He appears well-developed and well-nourished.  HENT:  Head: Normocephalic and atraumatic.  Eyes: Conjunctivae are normal.  Neck: Normal range of motion.  Pulmonary/Chest: Effort normal.      Neurological: He is alert and oriented to person, place, and time.  Skin: Skin is warm and dry.  Psychiatric: He has a normal mood and affect.  Vitals reviewed.   PROCEDURE NOTE: I&D of Abscess Verbal consent obtained. Local anesthesia with 2cc of 2% lidocaine with epinephrine. Site cleansed with Betadine.  Incision of 0.75cm was made using a 11 blade, discharge of copious amounts of pus and serosanguinous fluid. Wound cavity  was explored with curved hemostats and aggressively packed with 0.25" plain packing. Cleansed and dressed. After care instructions provided. Patient to return to clinic on 2 for reevaluation/repacking.  No results found for this or any previous visit (from the past 24 hour(s)).  Assessment and Plan :  1. Abscess of trunk -Wound care instructions given, follow up in 2 days for packing removal or sooner if he develops any concerning signs of infection. He will probably not need further packing as depth of wound was very shallow.  -Continue taking doxycycline as prescribed - WOUND CULTURE  Tenna Delaine, PA-C [facility] [phone] 11/24/2015 9:54 AM

## 2015-11-26 ENCOUNTER — Ambulatory Visit (INDEPENDENT_AMBULATORY_CARE_PROVIDER_SITE_OTHER): Payer: BLUE CROSS/BLUE SHIELD | Admitting: Physician Assistant

## 2015-11-26 VITALS — BP 112/74 | HR 80 | Temp 97.4°F | Resp 18 | Ht 71.0 in | Wt 161.8 lb

## 2015-11-26 DIAGNOSIS — L02219 Cutaneous abscess of trunk, unspecified: Secondary | ICD-10-CM

## 2015-11-26 LAB — WOUND CULTURE: ORGANISM ID, BACTERIA: NORMAL

## 2015-11-26 NOTE — Progress Notes (Signed)
    MRN: [id number] DOB: 20-Jan-1980  Subjective:   Hunter Villarreal is a 35 y.o. male presenting for follow up on abscess I&D. Initially seen on 11/24/15 for abscess I&D of trunk. Wound was packed and pt placed on 10 day course of doxycycline. Instructed to follow up in 2 days. He has been taking antibiotic as prescribed. He has noticed some purulent discharge from wound. Denies  fever, chills, nausea, or vomiting. He has been keeping his wound covered and changing it after showering.   Deyonte has a current medication list which includes the following prescription(s): blood glucose monitoring suppl, doxycycline, glipizide, glipizide, metformin, and metformin. Also has No Known Allergies.  Daquawn  has a past medical history of Diabetes mellitus without complication ([place]). Also  has a past surgical history that includes Repair knee ligament.   Objective:   Vitals: BP 112/74   Pulse 80   Temp 97.4 F (36.3 C) (Oral)   Resp 18   Ht 5\' 11"  (1.803 m)   Wt 161 lb 12.8 oz (73.4 kg)   SpO2 97%   BMI 22.57 kg/m   Physical Exam  Constitutional: He is oriented to person, place, and time. He appears well-developed and well-nourished.  HENT:  Head: Normocephalic and atraumatic.  Eyes: Conjunctivae are normal.  Neck: Normal range of motion.  Pulmonary/Chest: Effort normal.  Neurological: He is alert and oriented to person, place, and time.  Skin: Skin is warm and dry.     Psychiatric: He has a normal mood and affect.  Vitals reviewed.   Wound Care:  Consent obtained from patient. Area wiped with alcohol prep pad. Local anesthesia with 5cc lidocaine with epi used. Wound cavity cleansed with soap and water. Purulent nonviable tissue debrided. Wound cleansed and dressed.  No results found for this or any previous visit (from the past 24 hour(s)).   Wt Readings from Last 3 Encounters:  11/26/15 161 lb 12.8 oz (73.4 kg)  11/24/15 160 lb (72.6 kg)  11/20/15 152 lb (68.9 kg)     Assessment and  Plan :  1. Abscess of trunk -Wound care performed in office. Pt instructed that due to his diabetes, it may take longer for this wound to fully heal. He is to come back if it appears the wound is not healing appropriately. Instructed to finish course of antibiotics and to keep wound covered. Change dressing after it becomes soaked.  Tenna Delaine, PA-C  [facility] 11/26/2015 9:15 AM

## 2015-11-26 NOTE — Patient Instructions (Addendum)
  Continue to keep wound covered.Change dressing after it becomes soaked in the shower. Continue antibiotics. Use warm compress to affected area.   Follow up if it does not close properly.   If you have any questions, let me know.   Thank you for letting me participate in your health and well being.     IF you received an x-ray today, you will receive an invoice from [facility]. Please contact [facility] at [phone] with questions or concerns regarding your invoice.   IF you received labwork today, you will receive an invoice from [employer]. Please contact [facility] at [phone] with questions or concerns regarding your invoice.   Our billing staff will not be able to assist you with questions regarding bills from these companies.  You will be contacted with the lab results as soon as they are available. The fastest way to get your results is to activate your My Chart account. Instructions are located on the last page of this paperwork. If you have not heard from [place] regarding the results in 2 weeks, please contact this office.

## 2015-12-07 ENCOUNTER — Encounter: Payer: Self-pay | Admitting: Endocrinology

## 2015-12-07 ENCOUNTER — Ambulatory Visit (INDEPENDENT_AMBULATORY_CARE_PROVIDER_SITE_OTHER): Payer: BLUE CROSS/BLUE SHIELD | Admitting: Endocrinology

## 2015-12-07 DIAGNOSIS — E119 Type 2 diabetes mellitus without complications: Secondary | ICD-10-CM | POA: Diagnosis not present

## 2015-12-07 MED ORDER — LINAGLIPTIN 5 MG PO TABS: 5.0000 mg | ORAL_TABLET | Freq: Every day | ORAL | 11 refills | Status: DC

## 2015-12-07 MED ORDER — METFORMIN HCL ER 500 MG PO TB24: 2000.0000 mg | ORAL_TABLET | Freq: Every day | ORAL | 11 refills | Status: DC

## 2015-12-07 NOTE — Progress Notes (Signed)
Subjective:    Patient ID: Hunter Villarreal, male    DOB: [birth year], 35 y.o.   MRN: [id number]  HPI pt is referred by Tenna Delaine, PA-C, for diabetes.  Pt states DM was dx'ed in 2006; he has mild if any neuropathy of the lower extremities; he is unaware of any associated chronic complications; he has never been on insulin; pt says his diet is poor, but exercise is good; he has never had pancreatitis, severe hypoglycemia or DKA.  However, he was recently seen in ER for nonketotic hyperosmolar hyperglycemic state, after he had been off meds.  Since he has been back on meds, he says cbg's are in the 200's.  Past Medical History:  Diagnosis Date  . Diabetes mellitus without complication [facility])     Past Surgical History:  Procedure Laterality Date  . REPAIR KNEE LIGAMENT      Social History   Social History  . Marital status: Single    Spouse name: N/A  . Number of children: N/A  . Years of education: N/A   Occupational History  . Not on file.   Social History Main Topics  . Smoking status: Current Every Day Smoker    Packs/day: 0.50    Types: Cigarettes  . Smokeless tobacco: Never Used  . Alcohol use Yes  . Drug use: No  . Sexual activity: No   Other Topics Concern  . Not on file   Social History Narrative  . No narrative on file    Current Outpatient Prescriptions on File Prior to Visit  Medication Sig Dispense Refill  . Blood Glucose Monitoring Suppl w/Device KIT 1 Units by Does not apply route daily. 1 each 0  . glipiZIDE (GLUCOTROL) 5 MG tablet Take 1 tablet (5 mg total) by mouth daily before breakfast. 30 tablet 0   No current facility-administered medications on file prior to visit.     No Known Allergies  Family History  Problem Relation Age of Onset  . Diabetes Brother     type 2    BP 116/62   Pulse 77   Ht _0  (1.803 m)   Wt 158 lb (71.7 kg)   SpO2 96%   BMI 22.04 kg/m    Review of Systems denies weight loss, blurry vision, headache,  chest pain, sob, n/v, urinary frequency, muscle cramps, depression, cold intolerance, and rhinorrhea.  he has intermittent diarrhea and excessive diaphoresis.       Objective:   Physical Exam VS: see vs page GEN: no distress HEAD: head: no deformity eyes: no periorbital swelling, no proptosis.  external nose and ears are normal mouth: no lesion seen NECK: supple, thyroid is not enlarged CHEST WALL: no deformity LUNGS: clear to auscultation CV: reg rate and rhythm, no murmur ABD: abdomen is soft, nontender.  no hepatosplenomegaly.  not distended.  no hernia MUSCULOSKELETAL: muscle bulk and strength are grossly normal.  no obvious joint swelling.  gait is normal and steady EXTEMITIES: no deformity.  no ulcer on the feet.  feet are of normal color and temp.  no edema PULSES: dorsalis pedis intact bilat.  no carotid bruit NEURO:  cn 2-12 grossly intact.   readily moves all 4's.  sensation is intact to touch on the feet SKIN:  Normal texture and temperature.  No rash or suspicious lesion is visible.   NODES:  None palpable at the neck.   PSYCH: alert, well-oriented.  Does not appear anxious nor depressed.   Lab Results  Component  Value Date   HGBA1C >=14.0 11/20/2015   I have reviewed outside records, and summarized: Pt was noted to have elevated a1c, and referred here.  He was seen in ER, for nonketotic hyperosmolar hyperglycemic state.  DKA was suspected, and urine ketones were pos, but bicarb was normal.      Assessment & Plan:  DM, new to me: he is evolving type 1.  He needs increased rx Patient is advised the following: Patient Instructions  good diet and exercise significantly improve the control of your diabetes.  please let me know if you wish to be referred to a dietician.  high blood sugar is very risky to your health.  you should see an eye doctor and dentist every year.  It is very important to get all recommended vaccinations.  Controlling your blood pressure and  cholesterol drastically reduces the damage diabetes does to your body.  Those who smoke should quit.  Please discuss these with your doctor.  check your blood sugar twice a day.  vary the time of day when you check, between before the 3 meals, and at bedtime.  also check if you have symptoms of your blood sugar being too high or too low.  please keep a record of the readings and bring it to your next appointment here (or you can bring the meter itself).  You can write it on any piece of paper.  please call us sooner if your blood sugar goes below 70, or if you have a lot of readings over 200.   For now, please: Change the metformin to extended-release, and: continue the same glipizide, and: Add "tradjenta." I have sent prescriptions to your pharmacy, for all of these. Please see Vaughan Basta, to learn how to take the insulin.   Please come back for a follow-up appointment in 6 weeks.

## 2015-12-07 NOTE — Patient Instructions (Addendum)
good diet and exercise significantly improve the control of your diabetes.  please let me know if you wish to be referred to a dietician.  high blood sugar is very risky to your health.  you should see an eye doctor and dentist every year.  It is very important to get all recommended vaccinations.  Controlling your blood pressure and cholesterol drastically reduces the damage diabetes does to your body.  Those who smoke should quit.  Please discuss these with your doctor.  check your blood sugar twice a day.  vary the time of day when you check, between before the 3 meals, and at bedtime.  also check if you have symptoms of your blood sugar being too high or too low.  please keep a record of the readings and bring it to your next appointment here (or you can bring the meter itself).  You can write it on any piece of paper.  please call us sooner if your blood sugar goes below 70, or if you have a lot of readings over 200.   For now, please: Change the metformin to extended-release, and: continue the same glipizide, and: Add "tradjenta." I have sent prescriptions to your pharmacy, for all of these. Please see Vaughan Basta, to learn how to take the insulin.   Please come back for a follow-up appointment in 6 weeks.

## 2016-01-01 ENCOUNTER — Encounter: Payer: BLUE CROSS/BLUE SHIELD | Admitting: Nutrition

## 2016-01-08 ENCOUNTER — Encounter: Payer: BLUE CROSS/BLUE SHIELD | Admitting: Nutrition

## 2016-01-10 ENCOUNTER — Encounter: Payer: BLUE CROSS/BLUE SHIELD | Admitting: Dietician

## 2016-01-16 ENCOUNTER — Telehealth: Payer: Self-pay | Admitting: Endocrinology

## 2016-01-16 ENCOUNTER — Encounter: Payer: BLUE CROSS/BLUE SHIELD | Attending: Endocrinology | Admitting: Nutrition

## 2016-01-16 DIAGNOSIS — Z713 Dietary counseling and surveillance: Secondary | ICD-10-CM | POA: Insufficient documentation

## 2016-01-16 DIAGNOSIS — E118 Type 2 diabetes mellitus with unspecified complications: Secondary | ICD-10-CM

## 2016-01-16 DIAGNOSIS — E1165 Type 2 diabetes mellitus with hyperglycemia: Secondary | ICD-10-CM | POA: Insufficient documentation

## 2016-01-16 MED ORDER — INSULIN GLARGINE 100 UNIT/ML SOLOSTAR PEN: 10.0000 [IU] | PEN_INJECTOR | SUBCUTANEOUS | 99 refills | Status: DC

## 2016-01-16 NOTE — Telephone Encounter (Signed)
please start lantus, 15 units qam. I have sent a prescription to your pharmacy Please continue the same orals. Ov next week.

## 2016-01-17 ENCOUNTER — Encounter: Payer: BLUE CROSS/BLUE SHIELD | Admitting: Dietician

## 2016-01-17 ENCOUNTER — Encounter: Payer: Self-pay | Admitting: Dietician

## 2016-01-17 DIAGNOSIS — E119 Type 2 diabetes mellitus without complications: Secondary | ICD-10-CM

## 2016-01-17 DIAGNOSIS — Z713 Dietary counseling and surveillance: Secondary | ICD-10-CM | POA: Diagnosis not present

## 2016-01-17 NOTE — Patient Instructions (Signed)
Consistent meal schedule.  Breakfast, lunch, dinner spaced about 5 hours apart. Check your blood sugar prior to exercise.  Eat a snack if it is in the normal range.  Bake, broil, grill rather than fry. Decrease amounts of fats, especially mayo, high fat meat, butter, etc. Consider choosing nuts rather than chips for a snack.  Aim for 4-5 Carb Choices per meal (60-75 grams) +/- 1 either way  Aim for 0-2 Carbs per snack if hungry  Include protein in moderation with your meals and snacks Consider reading food labels for Total Carbohydrate and Fat Grams of foods Consider  increasing your activity level by walking or other exercise you enjoy for 30 minutes daily as tolerated Consider checking BG at alternate times per day as directed by MD  Continue taking medication as directed by MD

## 2016-01-17 NOTE — Progress Notes (Signed)
Patient did not bring meter.  He said that he ate 14 hours ago, and I tested his blood sugar.  It was 262.  Dr. Loanne Drilling notified and per his verbal/written order, he was instructed on take 15u of Lantus insulin once a day. We reviewed how the insulin works, when/and how to take this.  He re demonstrated this, and gave himself 15u of Lantus via pen, into the upper left abdomen at 3:30PM without an difficulty.   We also reviewed low blood sugars-symptoms and treatments, and he was given a sample of glucose tablets to use for this.   He was also given a sample of 58mm pen needles to use as well.   He works 3rd shift at [employer]. Typical day: 8AM-10AM home from work, and sleeps until 4PM 4PM-10PM up, and eats 2 meals.  One at [place], and one at 9PM.   10PM-3AM: works, nothing to eat.  Drinks diet soda 3AM may have a snack of crackers, or sandwich 3:30AM-6, or 7, or 8 (depending on amount of work needed).  Then goes home and sleeps.   Suggested he take his Lantus when he wakes up at 4PM each day.  He agreed to do this Also suggested he test his blood sugar when wakeing, at 9PM before supper, and when he gets home at [place] until he sees Dr. Loanne Drilling in one week  He was told to call here if his blood sugars drop before then.  He agreed to do this. He was given another Accu-chek meter to use while at work, with extra test strips.  He was very grateful. He had no final questions

## 2016-01-17 NOTE — Progress Notes (Signed)
Medical Nutrition Therapy:  Appt start time: 6045 end time:  4098.   Assessment:  Primary concerns today: Patient is here today with his girlfriend.  He want to learn what to eat and how much to help control his blood sugar.  He wants to be healthier and verbalized importance of change for his health. Hx includes type 2 Diabetes since 2006 with concern for evolving type 1 diabetes.  He continues to smoke and verbalized that he needs to make this decision himself.  He also has neuropathy and states that he cataracts.   Labs include A1C >14 11/20/15. He checks his blood sugar 2-3 times per day. In the office today CBG was 247. He is now motivated to care for his diabetes but struggled with this in the past.  Patient lives with his girlfriend and daughter.  His girlfriend does the shopping and cooking and is very supportive to help him control his blood sugar.  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  He also has 2 sons that he helps care for.    Preferred Learning Style:   No preference indicated   Learning Readiness:   Ready  Change in progress   MEDICATIONS: see list to include Glucotrol, Metformin (extended release), Lantus 15 units daily at 4 pm, Tradjenta.  He reports remembering to take his medication.   DIETARY INTAKE: Sleeps generally after night shift (7:30-4) OR (9-4), NO SLEEP on Saturday's or Monday's. Usual eating pattern includes 3 meals and 1 snacks per day.  Everyday foods include fast foods.  Avoided foods include concentrated sweets and sugar sweetened beverages.    24-hr recall:  B (4 pm): oatmeal OR eggs, sausage, biscuit OR 2 double cheeseburgers Snk ( AM):   L (8-9 PM): sandwich with meat and cheese OR leftovers OR spaghetti and meatballs Snk (12 PM): chips D (3:30 PM): 1 hot dog, fries Snk ( PM): occasional eggs and sausage Beverages: diet soda (2 liters per day), water, coffee occasionally with cream and  splenda  Usual physical activity: none.  Going to begin lifting weights.  Estimated energy needs: 2000 calories 225 g carbohydrates 150 g protein 56 g fat  Progress Towards Goal(s):  In progress.   Nutritional Diagnosis:  NB-1.1 Food and nutrition-related knowledge deficit As related to balance of carbohydrate, protein, and fat.  As evidenced by diet hx and patient report.    Intervention:  Nutrition counseling and diabetes education initiated. Discussed Carb Counting by food group as method of portion control, reading food labels, and benefits of increased activity. Also discussed basic physiology of Diabetes, target BG ranges pre and post meals, and A1c.   Consistent meal schedule.  Breakfast, lunch, dinner spaced about 5 hours apart. Check your blood sugar prior to exercise.  Eat a snack if it is in the normal range.  Bake, broil, grill rather than fry. Decrease amounts of fats, especially mayo, high fat meat, butter, etc. Consider choosing nuts rather than chips for a snack.  Aim for 4-5 Carb Choices per meal (60-75 grams) +/- 1 either way  Aim for 0-2 Carbs per snack if hungry  Include protein in moderation with your meals and snacks Consider reading food labels for Total Carbohydrate and Fat Grams of foods Consider  increasing your activity level by walking or other exercise you enjoy for 30 minutes daily as tolerated Consider checking BG at alternate times per day as directed by MD  Continue taking  medication as directed by MD  Teaching Method Utilized:  Visual Auditory Hands on  Handouts given during visit include: Living Well with Diabetes Food Label handouts Meal Plan Card  Snack list  A1C sheet  Healthy fast food options  Label reading  Barriers to learning/adherence to lifestyle change: works 3rd shift  Demonstrated degree of understanding via:  Teach Back   Monitoring/Evaluation:  Dietary intake, exercise, label reading, and body weight prn.

## 2016-01-21 ENCOUNTER — Telehealth: Payer: Self-pay | Admitting: Nutrition

## 2016-01-21 NOTE — Telephone Encounter (Signed)
Message left on answering machine to call me with blood sugars, and to see what dosage he is taking.

## 2016-01-23 ENCOUNTER — Ambulatory Visit: Payer: BLUE CROSS/BLUE SHIELD | Admitting: Endocrinology

## 2016-01-28 ENCOUNTER — Ambulatory Visit: Payer: BLUE CROSS/BLUE SHIELD | Admitting: Endocrinology

## 2016-01-28 NOTE — Telephone Encounter (Signed)
Patient ask you to give him a call

## 2016-01-28 NOTE — Telephone Encounter (Signed)
Pt. Reports that he has been taking 15u q 5PM.  He reports that the lowest reading was 95mg /dl, but that most are in the upper 100s (180s-195) in the morning.   He reports no difficulty giving self injection, or using the insulin in any way.  He will be seeing Dr. Loanne Drilling in one week.   He was told to call the office if blood sugars drop low before then.

## 2016-01-29 NOTE — Patient Instructions (Signed)
Take 15u once a day Call if blood sugars drop below 80, or remain over 250. See dr. Loanne Drilling in one week.

## 2016-01-31 ENCOUNTER — Ambulatory Visit: Payer: BLUE CROSS/BLUE SHIELD | Admitting: Endocrinology

## 2016-02-08 ENCOUNTER — Encounter: Payer: Self-pay | Admitting: Endocrinology

## 2016-02-08 ENCOUNTER — Ambulatory Visit (INDEPENDENT_AMBULATORY_CARE_PROVIDER_SITE_OTHER): Payer: BLUE CROSS/BLUE SHIELD | Admitting: Endocrinology

## 2016-02-08 VITALS — BP 124/70 | HR 88 | Ht 71.0 in | Wt 167.0 lb

## 2016-02-08 DIAGNOSIS — E119 Type 2 diabetes mellitus without complications: Secondary | ICD-10-CM

## 2016-02-08 LAB — POCT GLYCOSYLATED HEMOGLOBIN (HGB A1C): Hemoglobin A1C: 10.2

## 2016-02-08 MED ORDER — INSULIN GLARGINE 100 UNIT/ML SOLOSTAR PEN: 25.0000 [IU] | PEN_INJECTOR | Freq: Every day | SUBCUTANEOUS | 11 refills | Status: DC

## 2016-02-08 NOTE — Progress Notes (Signed)
Subjective:    Patient ID: Hunter Villarreal, male    DOB: [birth year], 36 y.o.   MRN: [id number]  HPI Pt returns for f/u of diabetes mellitus: DM type: Insulin-requiring type 2 (but labile glucoses and lean body habitus suggest he is evolving type 1) Dx'ed: 3382 Complications: none Therapy: insulin since 2017.  DKA: never Severe hypoglycemia: never. Pancreatitis: never Other: in 2017, he was seen in ER for nonketotic hyperosmolar hyperglycemic state, when he was off meds Interval history: He takes 15 units qd.  no cbg record, but states cbg's vary from 95-330.  It is lowest when you are active at work.  He takes 15 units qam.  He has stopped glipizide.  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   Past Medical History:  Diagnosis Date  . Diabetes mellitus without complication [facility] [place])     Past Surgical History:  Procedure Laterality Date  . REPAIR KNEE LIGAMENT      Social History   Social History  . Marital status: Single    Spouse name: N/A  . Number of children: N/A  . Years of education: N/A   Occupational History  . Not on file.   Social History Main Topics  . Smoking status: Current Every Day Smoker    Packs/day: 0.50    Types: Cigarettes  . Smokeless tobacco: Never Used  . Alcohol use Yes  . Drug use: No  . Sexual activity: No   Other Topics Concern  . Not on file   Social History Narrative  . No narrative on file    Current Outpatient Prescriptions on File Prior to Visit  Medication Sig Dispense Refill  . Blood Glucose Monitoring Suppl w/Device KIT 1 Units by Does not apply route daily. 1 each 0  . metFORMIN (GLUCOPHAGE-XR) 500 MG 24 hr tablet Take 4 tablets (2,000 mg total) by mouth daily with breakfast. 120 tablet 11   No current facility-administered medications on file prior to visit.     No Known Allergies  Family History  Problem Relation Age of Onset  . Diabetes Brother     type 2    BP 124/70   Pulse 88   Ht '5\' 11"'$  (1.803 m)    Wt 167 lb (75.8 kg)   SpO2 98%   BMI 23.29 kg/m   Review of Systems He denies hypoglycemia.      Objective:   Physical Exam VITAL SIGNS:  See vs page GENERAL: no distress SKIN:  Insulin injection sites at the anterior abdomen are normal.    A1c=10.2%    Assessment & Plan:  Insulin-requiring type 2 DM: he needs increased rx.   Patient is advised the following: Patient Instructions  check your blood sugar twice a day.  vary the time of day when you check, between before the 3 meals, and at bedtime.  also check if you have symptoms of your blood sugar being too high or too low.  please keep a record of the readings and bring it to your next appointment here (or you can bring the meter itself).  You can write it on any piece of paper.  please call us sooner if your blood sugar goes below 70, or if you have a lot of readings over 200.   For now, please: Stop taking the tradjenta, and: Increase the insulin to 20 units daily.  Take 25 units if you are not going to work that night. Please continue the same metformin.  Please come back for a follow-up appointment in 1 month.

## 2016-02-08 NOTE — Patient Instructions (Addendum)
check your blood sugar twice a day.  vary the time of day when you check, between before the 3 meals, and at bedtime.  also check if you have symptoms of your blood sugar being too high or too low.  please keep a record of the readings and bring it to your next appointment here (or you can bring the meter itself).  You can write it on any piece of paper.  please call us sooner if your blood sugar goes below 70, or if you have a lot of readings over 200.   For now, please: Stop taking the tradjenta, and: Increase the insulin to 20 units daily.  Take 25 units if you are not going to work that night. Please continue the same metformin.   Please come back for a follow-up appointment in 1 month.

## 2016-03-07 ENCOUNTER — Ambulatory Visit (INDEPENDENT_AMBULATORY_CARE_PROVIDER_SITE_OTHER): Payer: BLUE CROSS/BLUE SHIELD | Admitting: Endocrinology

## 2016-03-07 VITALS — BP 128/82 | HR 74 | Ht 71.0 in | Wt 170.0 lb

## 2016-03-07 DIAGNOSIS — E119 Type 2 diabetes mellitus without complications: Secondary | ICD-10-CM

## 2016-03-07 MED ORDER — INSULIN GLARGINE 100 UNIT/ML SOLOSTAR PEN: 18.0000 [IU] | PEN_INJECTOR | Freq: Every day | SUBCUTANEOUS | 11 refills | Status: DC

## 2016-03-07 NOTE — Patient Instructions (Addendum)
check your blood sugar twice a day.  vary the time of day when you check, between before the 3 meals, and at bedtime.  also check if you have symptoms of your blood sugar being too high or too low.  please keep a record of the readings and bring it to your next appointment here (or you can bring the meter itself).  You can write it on any piece of paper.  please call us sooner if your blood sugar goes below 70, or if you have a lot of readings over 200.   For now, please take lantus, 18 units each night.  blood tests are requested for you today.  We'll let you know about the results.  Please come back for a follow-up appointment in 3 months.

## 2016-03-07 NOTE — Progress Notes (Signed)
Subjective:    Patient ID: Hunter Villarreal, male    DOB: Sep 07, 1980, 36 y.o.   MRN: [id number]  HPI Pt returns for f/u of diabetes mellitus: DM type: Insulin-requiring type 2 (but labile glucoses and lean body habitus suggest he is evolving type 1).  Dx'ed: 2006.  Complications: none.  Therapy: insulin since 2017.  DKA: never.  Severe hypoglycemia: never.   Pancreatitis: never.   Other: in 2017, he was seen in ER for nonketotic hyperosmolar hyperglycemic state, when he was off meds.  Due to h/o missing insulin, he is on a qd insulin schedule.   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 He takes 18 units qhs.  no cbg record, but states cbg's vary from 80-180.  He says he is much more active recently.  He says he seldom misses the insulin.  Past Medical History:  Diagnosis Date  . Diabetes mellitus without complication [facility])     Past Surgical History:  Procedure Laterality Date  . REPAIR KNEE LIGAMENT      Social History   Social History  . Marital status: Single    Spouse name: N/A  . Number of children: N/A  . Years of education: N/A   Occupational History  . Not on file.   Social History Main Topics  . Smoking status: Current Every Day Smoker    Packs/day: 0.50    Types: Cigarettes  . Smokeless tobacco: Never Used  . Alcohol use Yes  . Drug use: No  . Sexual activity: No   Other Topics Concern  . Not on file   Social History Narrative  . No narrative on file    Current Outpatient Prescriptions on File Prior to Visit  Medication Sig Dispense Refill  . Blood Glucose Monitoring Suppl w/Device KIT 1 Units by Does not apply route daily. 1 each 0  . metFORMIN (GLUCOPHAGE-XR) 500 MG 24 hr tablet Take 4 tablets (2,000 mg total) by mouth daily with breakfast. 120 tablet 11   No current facility-administered medications on file prior to visit.     No Known Allergies  Family History  Problem Relation Age of Onset  . Diabetes  Brother     type 2    BP 128/82   Pulse 74   Ht '5\' 11"'$  (1.803 m)   Wt 170 lb (77.1 kg)   SpO2 99%   BMI 23.71 kg/m   Review of Systems He denies hypoglycemia.  He has gained a few lbs.     Objective:   Physical Exam VITAL SIGNS:  See vs page GENERAL: no distress Pulses: dorsalis pedis intact bilat.   MSK: no deformity of the feet CV: no leg edema Skin:  no ulcer on the feet.  normal color and temp on the feet. Neuro: sensation is intact to touch on the feet.     Lab Results  Component Value Date   HGBA1C 10.2 02/08/2016      Assessment & Plan:  Type 1 DM: uncertain control.  Check fructosamine.   Patient is advised the following: Patient Instructions  check your blood sugar twice a day.  vary the time of day when you check, between before the 3 meals, and at bedtime.  also check if you have symptoms of your blood sugar being too high or too low.  please keep a record of the readings and bring it to your next appointment here (or you can bring the meter itself).  You  can write it on any piece of paper.  please call us sooner if your blood sugar goes below 70, or if you have a lot of readings over 200.   For now, please take lantus, 18 units each night.  blood tests are requested for you today.  We'll let you know about the results.  Please come back for a follow-up appointment in 3 months.

## 2016-03-10 LAB — FRUCTOSAMINE: FRUCTOSAMINE: 418 umol/L — AB (ref 190–270)

## 2016-05-26 ENCOUNTER — Telehealth: Payer: Self-pay | Admitting: Endocrinology

## 2016-05-26 MED ORDER — INSULIN PEN NEEDLE 31G X 5 MM MISC: 2 refills | Status: DC

## 2016-05-26 MED ORDER — INSULIN GLARGINE 100 UNIT/ML SOLOSTAR PEN: 18.0000 [IU] | PEN_INJECTOR | Freq: Every day | SUBCUTANEOUS | 11 refills | Status: DC

## 2016-05-26 NOTE — Telephone Encounter (Signed)
Refill submitted. 

## 2016-05-26 NOTE — Telephone Encounter (Signed)
Pt needs refills on his lantus solostar pen called into [facility] on [address]  And the pen needles please

## 2017-06-30 ENCOUNTER — Other Ambulatory Visit: Payer: Self-pay

## 2017-06-30 MED ORDER — INSULIN GLARGINE 100 UNIT/ML SOLOSTAR PEN: 18.0000 [IU] | PEN_INJECTOR | Freq: Every day | SUBCUTANEOUS | 0 refills | Status: DC

## 2018-02-05 IMAGING — CR DG CHEST 2V
2 series · 2 of 2 positions shown · non-contrast
Comparison: None.

CLINICAL DATA: Cough x2 week.  Diabetes.  Half pack per day smoker.

EXAM:
CHEST  2 VIEW

[w chest pa]
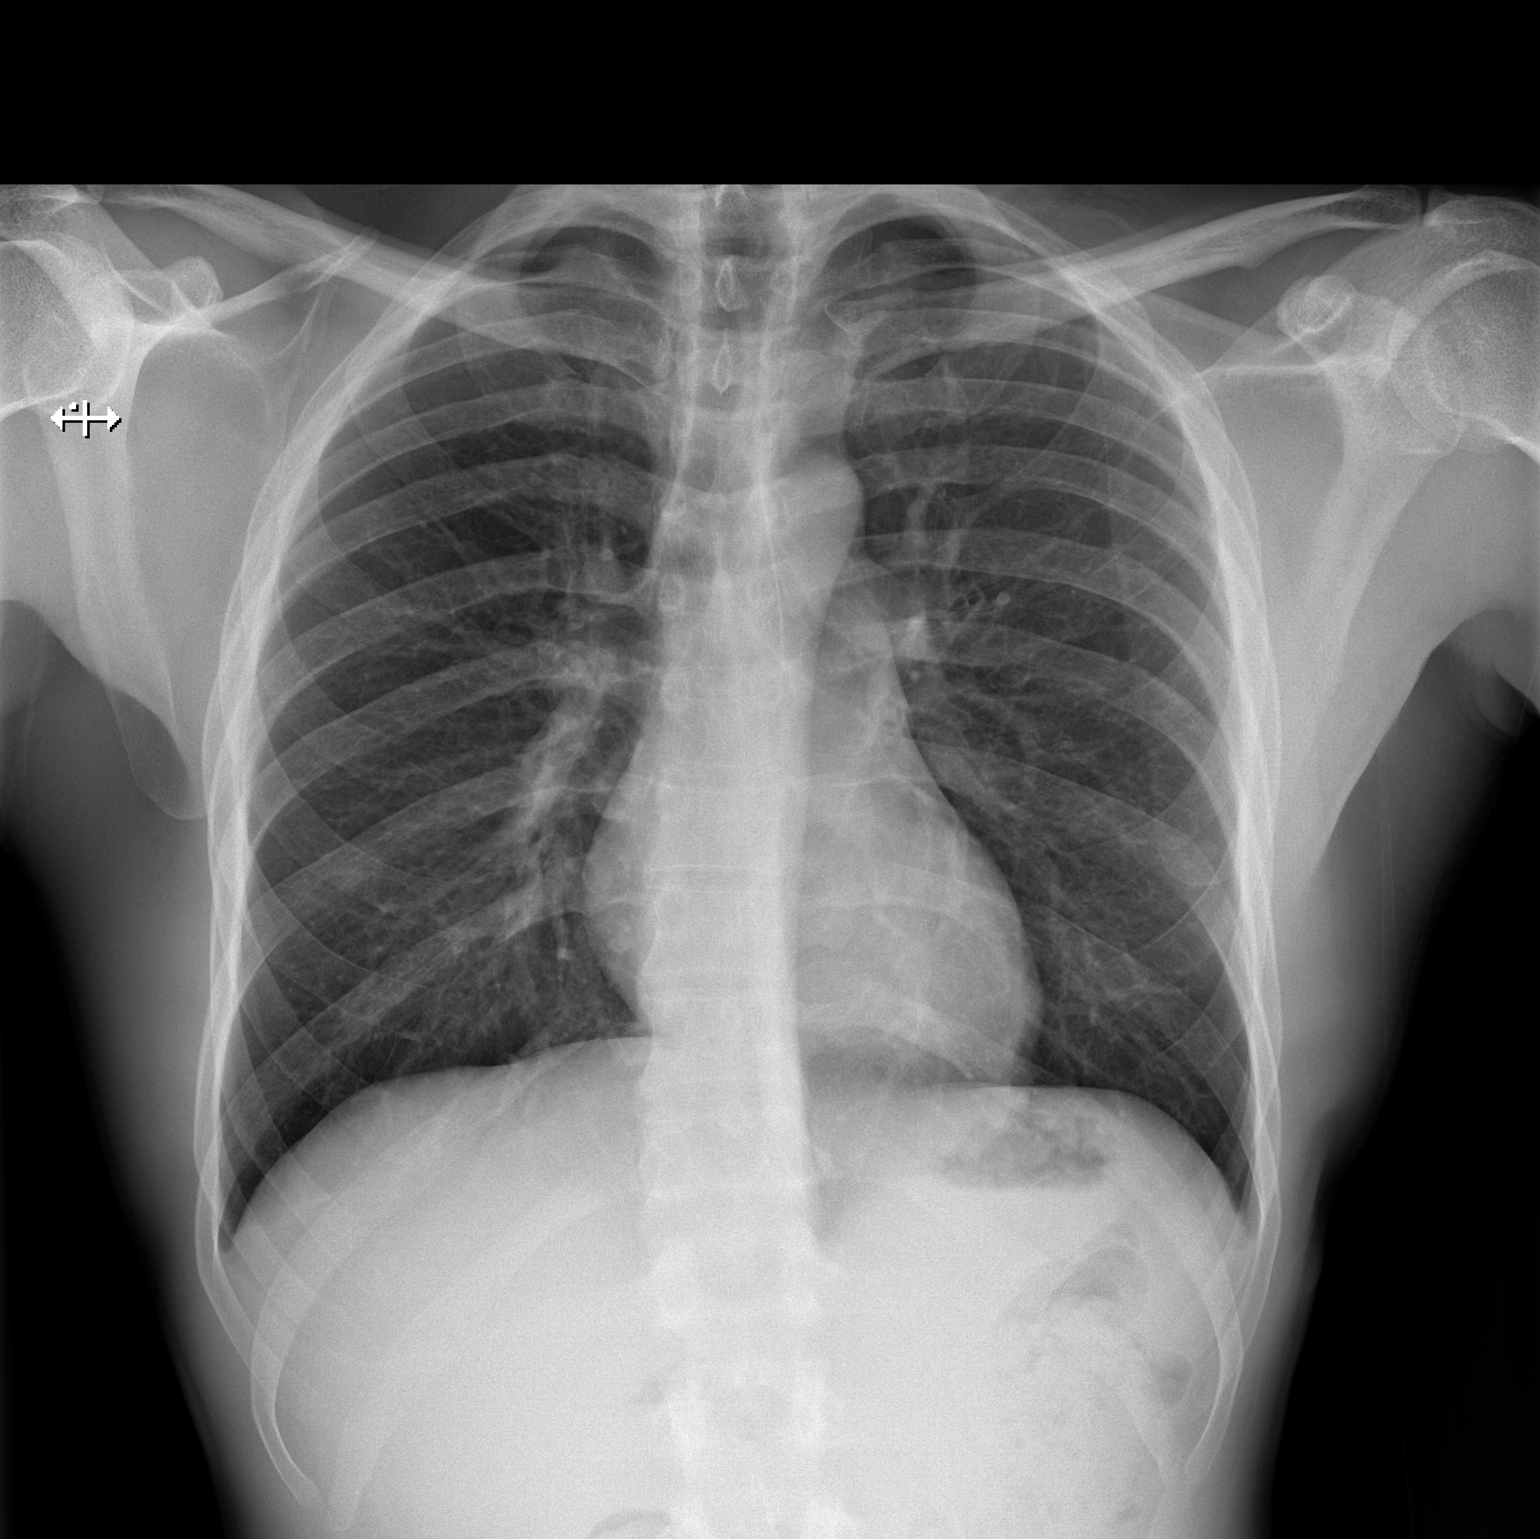

[w chest lat]
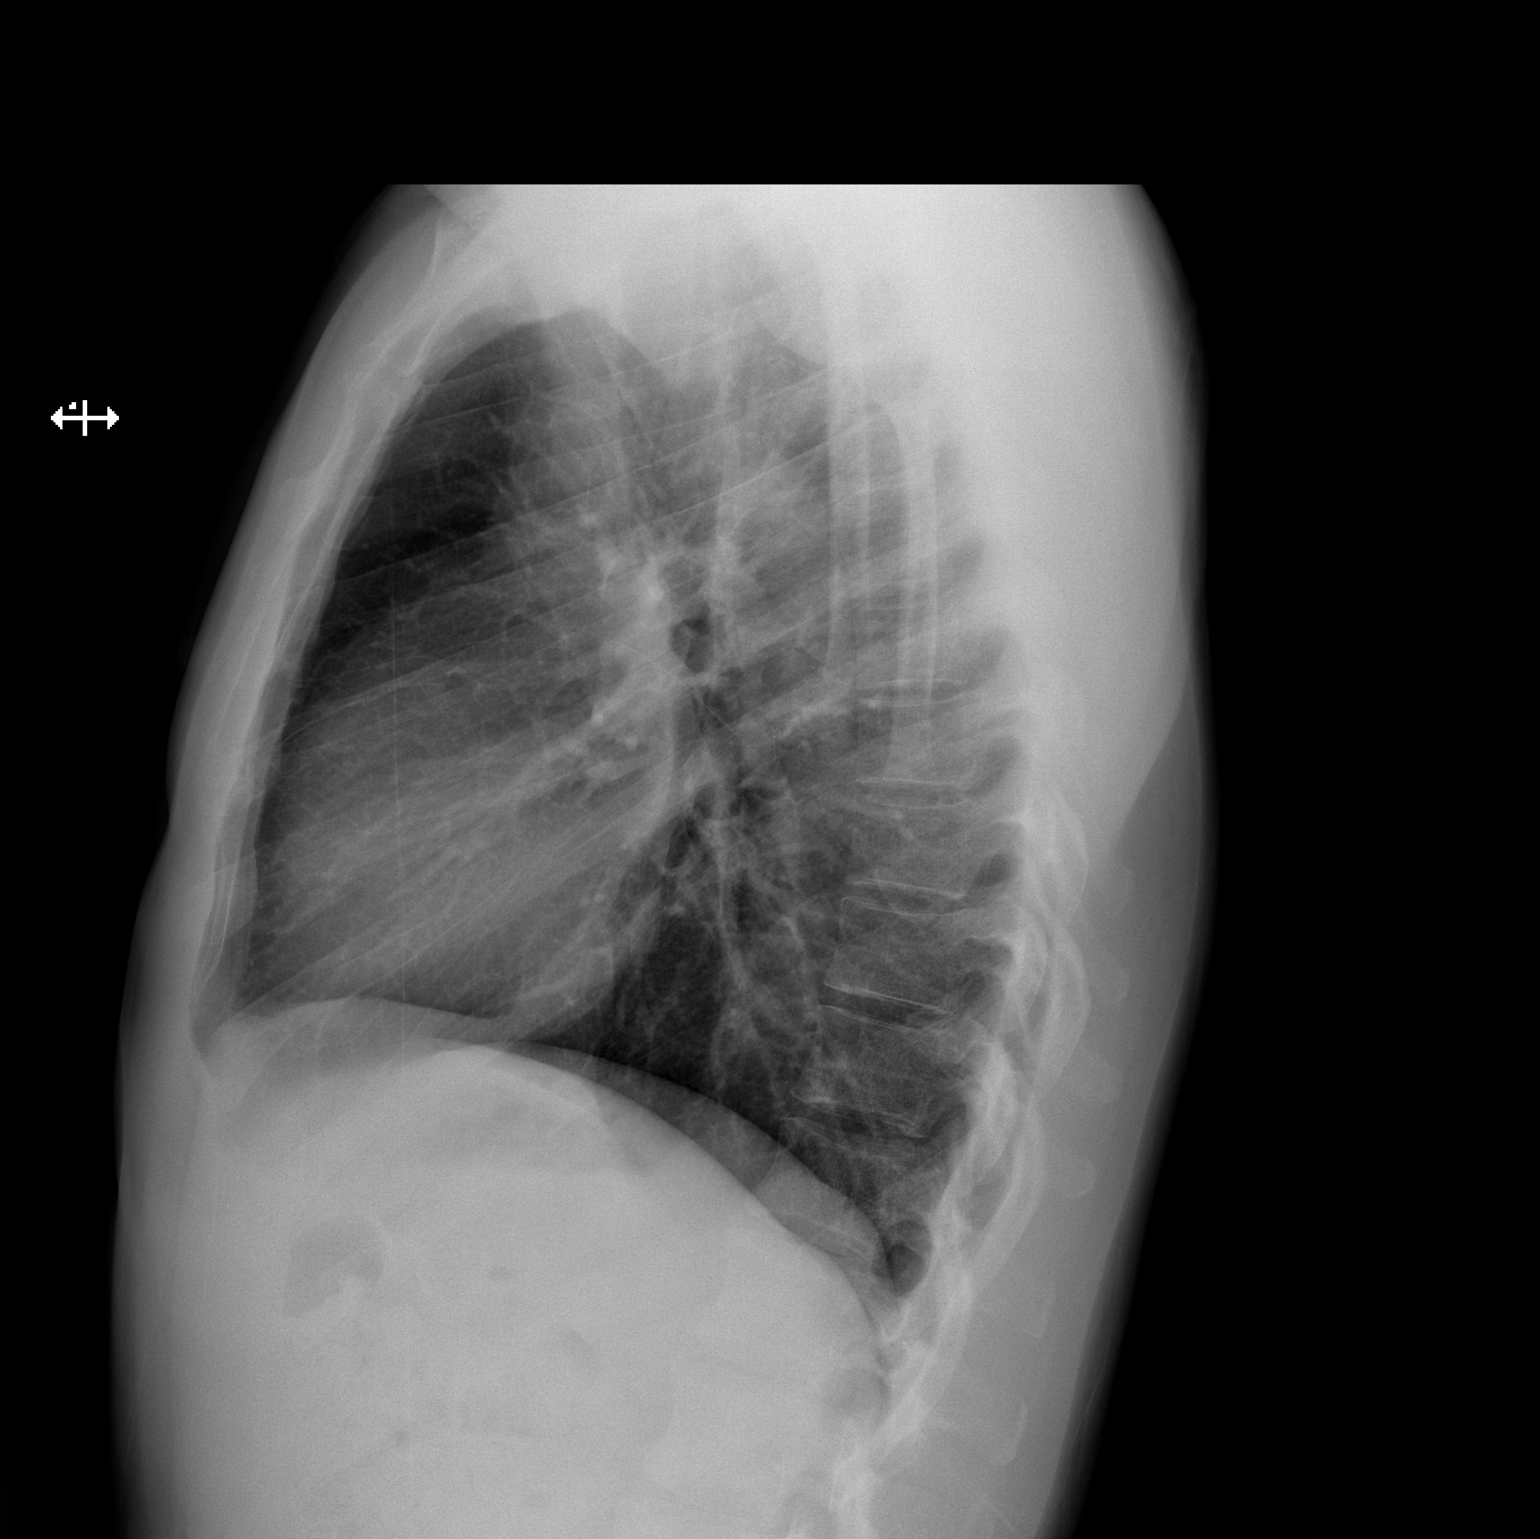

[2 of 2 positions shown; findings below may reference images not displayed]

FINDINGS: The heart size and mediastinal contours are within normal limits.
Both lungs are clear. The visualized skeletal structures are
unremarkable.
IMPRESSION: No active cardiopulmonary disease.

## 2018-02-16 ENCOUNTER — Other Ambulatory Visit: Payer: Self-pay | Admitting: Endocrinology

## 2018-06-21 ENCOUNTER — Other Ambulatory Visit: Payer: Self-pay | Admitting: Endocrinology

## 2018-06-21 NOTE — Telephone Encounter (Signed)
Please refill x 1 F/u is due  

## 2018-08-19 ENCOUNTER — Other Ambulatory Visit: Payer: Self-pay

## 2018-08-23 ENCOUNTER — Ambulatory Visit: Payer: BLUE CROSS/BLUE SHIELD | Admitting: Endocrinology

## 2018-08-23 DIAGNOSIS — Z0289 Encounter for other administrative examinations: Secondary | ICD-10-CM

## 2018-12-16 ENCOUNTER — Other Ambulatory Visit: Payer: Self-pay | Admitting: Endocrinology

## 2018-12-16 NOTE — Telephone Encounter (Signed)
Patient scheduled for F/U visit on Tuesday 12/21/2018 at 8:00 AM

## 2018-12-17 ENCOUNTER — Other Ambulatory Visit: Payer: Self-pay

## 2018-12-21 ENCOUNTER — Ambulatory Visit (INDEPENDENT_AMBULATORY_CARE_PROVIDER_SITE_OTHER): Payer: Managed Care, Other (non HMO) | Admitting: Endocrinology

## 2018-12-21 ENCOUNTER — Telehealth: Payer: Self-pay

## 2018-12-21 ENCOUNTER — Other Ambulatory Visit: Payer: Self-pay

## 2018-12-21 ENCOUNTER — Encounter: Payer: Self-pay | Admitting: Endocrinology

## 2018-12-21 VITALS — BP 136/84 | HR 89 | Ht 71.0 in | Wt 161.8 lb

## 2018-12-21 DIAGNOSIS — Z9114 Patient's other noncompliance with medication regimen: Secondary | ICD-10-CM

## 2018-12-21 DIAGNOSIS — E119 Type 2 diabetes mellitus without complications: Secondary | ICD-10-CM | POA: Diagnosis not present

## 2018-12-21 DIAGNOSIS — E1129 Type 2 diabetes mellitus with other diabetic kidney complication: Secondary | ICD-10-CM | POA: Diagnosis not present

## 2018-12-21 DIAGNOSIS — E11319 Type 2 diabetes mellitus with unspecified diabetic retinopathy without macular edema: Secondary | ICD-10-CM

## 2018-12-21 LAB — BASIC METABOLIC PANEL
BUN: 24 mg/dL — ABNORMAL HIGH (ref 6–23)
CO2: 30 mEq/L (ref 19–32)
Calcium: 9.1 mg/dL (ref 8.4–10.5)
Chloride: 105 mEq/L (ref 96–112)
Creatinine, Ser: 1.42 mg/dL (ref 0.40–1.50)
GFR: 66.83 mL/min (ref >60.00)
Glucose, Bld: 211 mg/dL — ABNORMAL HIGH (ref 70–99)
Potassium: 4.1 mEq/L (ref 3.5–5.1)
Sodium: 141 mEq/L (ref 135–145)

## 2018-12-21 LAB — POCT GLYCOSYLATED HEMOGLOBIN (HGB A1C): Hemoglobin A1C: 14.2 % — AB (ref 4.0–5.6)

## 2018-12-21 LAB — TSH: TSH: 1.09 u[IU]/mL (ref 0.35–4.50)

## 2018-12-21 MED ORDER — LANTUS SOLOSTAR 100 UNIT/ML ~~LOC~~ SOPN: 20.0000 [IU] | PEN_INJECTOR | Freq: Every day | SUBCUTANEOUS | 11 refills | Status: DC

## 2018-12-21 MED ORDER — ONETOUCH VERIO VI STRP: 1.0000 | ORAL_STRIP | Freq: Two times a day (BID) | 3 refills | Status: DC

## 2018-12-21 NOTE — Telephone Encounter (Signed)
-----   Message from Renato Shin, MD sent at 12/21/2018 12:52 PM EST ----- please contact patient: Normal except kidneys are slightly off again. I'll see you next time.

## 2018-12-21 NOTE — Patient Instructions (Signed)
Please continue the same insulin check your blood sugar twice a day.  vary the time of day when you check, between before the 3 meals, and at bedtime.  also check if you have symptoms of your blood sugar being too high or too low.  please keep a record of the readings and bring it to your next appointment here (or you can bring the meter itself).  You can write it on any piece of paper.  please call us sooner if your blood sugar goes below 70, or if you have a lot of readings over 200. Blood tests are requested for you today.  We'll let you know about the results.  Please come back for a follow-up appointment in 2 months.

## 2018-12-21 NOTE — Progress Notes (Signed)
Subjective:    Patient ID: Hunter Villarreal, male    DOB: [birth year], 38 y.o.   MRN: [id number]  HPI Pt returns for f/u of diabetes mellitus: DM type: Insulin-requiring type 2 (but labile glucoses and lean body habitus suggest he is evolving type 1).  Dx'ed: 2006.  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  DKA: never.  Severe hypoglycemia: never.   Pancreatitis: never.   Other: in 2017, he was seen in ER for nonketotic hyperosmolar hyperglycemic state, when he was off meds.  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 Interval history:  He was off insulin for many months, until he was able to resume 2 days ago.  Denies n/v/sob.   Past Medical History:  Diagnosis Date  . Diabetes mellitus without complication [facility])     Past Surgical History:  Procedure Laterality Date  . REPAIR KNEE LIGAMENT      Social History   Socioeconomic History  . Marital status: Single    Spouse name: Not on file  . Number of children: Not on file  . Years of education: Not on file  . Highest education level: Not on file  Occupational History  . Not on file  Tobacco Use  . Smoking status: Current Every Day Smoker    Packs/day: 0.50    Types: Cigarettes  . Smokeless tobacco: Never Used  Substance and Sexual Activity  . Alcohol use: Yes  . Drug use: No  . Sexual activity: Never  Other Topics Concern  . Not on file  Social History Narrative  . Not on file   Social Determinants of Health   Financial Resource Strain:   . Difficulty of Paying Living Expenses: Not on file  Food Insecurity:   . Worried About Charity fundraiser in the Last Year: Not on file  . Ran Out of Food in the Last Year: Not on file  Transportation Needs:   . Lack of Transportation (Medical): Not on file  . Lack of Transportation (Non-Medical): Not on file  Physical Activity:   . Days of Exercise per Week: Not on file  .  Minutes of Exercise per Session: Not on file  Stress:   . Feeling of Stress : Not on file  Social Connections:   . Frequency of Communication with Friends and Family: Not on file  . Frequency of Social Gatherings with Friends and Family: Not on file  . Attends Religious Services: Not on file  . Active Member of Clubs or Organizations: Not on file  . Attends Archivist Meetings: Not on file  . Marital Status: Not on file  Intimate Partner Violence:   . Fear of Current or Ex-Partner: Not on file  . Emotionally Abused: Not on file  . Physically Abused: Not on file  . Sexually Abused: Not on file    No current outpatient medications on file prior to visit.   No current facility-administered medications on file prior to visit.    No Known Allergies  Family History  Problem Relation Age of Onset  . Diabetes Brother        type 2    BP 136/84 (BP Location: Left Arm, Patient Position: Sitting, Cuff Size: Normal)   Pulse 89   Ht 5\' 11"  (1.803 m)   Wt 161 lb 12.8 oz (73.4 kg)   SpO2 98%   BMI 22.57 kg/m  Review of Systems He has lost 9 lbs since last ov    Objective:   Physical Exam VITAL SIGNS:  See vs page GENERAL: no distress Pulses: dorsalis pedis intact bilat.   MSK: no deformity of the feet CV: no leg edema Skin:  no ulcer on the feet.  normal color and temp on the feet. Neuro: sensation is intact to touch on the feet   Lab Results  Component Value Date   HGBA1C 14.2 (A) 12/21/2018       Assessment & Plan:  Insulin-requiring type 2 DM, with DR: worse Renal insuff: recheck today Noncompliance with insulin.  He is back on rx now.    Patient Instructions  Please continue the same insulin check your blood sugar twice a day.  vary the time of day when you check, between before the 3 meals, and at bedtime.  also check if you have symptoms of your blood sugar being too high or too low.  please keep a record of the readings and bring it to your next  appointment here (or you can bring the meter itself).  You can write it on any piece of paper.  please call us sooner if your blood sugar goes below 70, or if you have a lot of readings over 200. Blood tests are requested for you today.  We'll let you know about the results.  Please come back for a follow-up appointment in 2 months.

## 2018-12-21 NOTE — Telephone Encounter (Signed)
Lab results reviewed by Dr. Ellison. Letter has been mailed. For future reference, letter can be found in Epic. 

## 2019-02-28 ENCOUNTER — Ambulatory Visit: Payer: Managed Care, Other (non HMO) | Admitting: Endocrinology

## 2019-02-28 DIAGNOSIS — Z0289 Encounter for other administrative examinations: Secondary | ICD-10-CM

## 2019-05-09 ENCOUNTER — Other Ambulatory Visit: Payer: Self-pay

## 2019-05-09 ENCOUNTER — Ambulatory Visit (INDEPENDENT_AMBULATORY_CARE_PROVIDER_SITE_OTHER): Payer: Managed Care, Other (non HMO) | Admitting: Medical-Surgical

## 2019-05-09 ENCOUNTER — Encounter: Payer: Self-pay | Admitting: Medical-Surgical

## 2019-05-09 VITALS — BP 141/95 | HR 76 | Temp 98.1°F | Ht 69.75 in | Wt 167.9 lb

## 2019-05-09 DIAGNOSIS — N1831 Chronic kidney disease, stage 3a: Secondary | ICD-10-CM

## 2019-05-09 DIAGNOSIS — E782 Mixed hyperlipidemia: Secondary | ICD-10-CM

## 2019-05-09 DIAGNOSIS — E119 Type 2 diabetes mellitus without complications: Secondary | ICD-10-CM

## 2019-05-09 DIAGNOSIS — R030 Elevated blood-pressure reading, without diagnosis of hypertension: Secondary | ICD-10-CM

## 2019-05-09 DIAGNOSIS — Z7689 Persons encountering health services in other specified circumstances: Secondary | ICD-10-CM

## 2019-05-09 LAB — POCT GLYCOSYLATED HEMOGLOBIN (HGB A1C): Hemoglobin A1C: 9.9 % — AB (ref 4.0–5.6)

## 2019-05-09 LAB — COMPLETE METABOLIC PANEL WITH GFR
AG Ratio: 1.5 (calc) (ref 1.0–2.5)
ALT: 41 U/L (ref 9–46)
AST: 34 U/L (ref 10–40)
Albumin: 3.8 g/dL (ref 3.6–5.1)
Alkaline phosphatase (APISO): 78 U/L (ref 36–130)
BUN/Creatinine Ratio: 16 (calc) (ref 6–22)
BUN: 28 mg/dL — ABNORMAL HIGH (ref 7–25)
CO2: 30 mmol/L (ref 20–32)
Calcium: 9.3 mg/dL (ref 8.6–10.3)
Chloride: 110 mmol/L (ref 98–110)
Creat: 1.78 mg/dL — ABNORMAL HIGH (ref 0.60–1.35)
GFR, Est African American: 54 mL/min/{1.73_m2} — ABNORMAL LOW (ref >=60)
GFR, Est Non African American: 47 mL/min/{1.73_m2} — ABNORMAL LOW (ref >=60)
Globulin: 2.6 g/dL (calc) (ref 1.9–3.7)
Glucose, Bld: 133 mg/dL — ABNORMAL HIGH (ref 65–99)
Potassium: 4.8 mmol/L (ref 3.5–5.3)
Sodium: 144 mmol/L (ref 135–146)
Total Bilirubin: 0.4 mg/dL (ref 0.2–1.2)
Total Protein: 6.4 g/dL (ref 6.1–8.1)

## 2019-05-09 LAB — CBC
HCT: 35.3 % — ABNORMAL LOW (ref 38.5–50.0)
Hemoglobin: 11.9 g/dL — ABNORMAL LOW (ref 13.2–17.1)
MCH: 28.9 pg (ref 27.0–33.0)
MCHC: 33.7 g/dL (ref 32.0–36.0)
MCV: 85.7 fL (ref 80.0–100.0)
MPV: 10.3 fL (ref 7.5–12.5)
Platelets: 235 10*3/uL (ref 140–400)
RBC: 4.12 10*6/uL — ABNORMAL LOW (ref 4.20–5.80)
RDW: 12.3 % (ref 11.0–15.0)
WBC: 4.1 10*3/uL (ref 3.8–10.8)

## 2019-05-09 LAB — LIPID PANEL
Cholesterol: 200 mg/dL — ABNORMAL HIGH (ref <200)
HDL: 73 mg/dL (ref >=40)
LDL Cholesterol (Calc): 113 mg/dL (calc) — ABNORMAL HIGH
Non-HDL Cholesterol (Calc): 127 mg/dL (calc) (ref <130)
Total CHOL/HDL Ratio: 2.7 (calc) (ref <5.0)
Triglycerides: 47 mg/dL (ref <150)

## 2019-05-09 NOTE — Progress Notes (Signed)
New Patient Office Visit  Subjective:  Patient ID: Hunter Villarreal, male    DOB: 03/26/1980  Age: 39 y.o. MRN: [id number]  CC:  Chief Complaint  Patient presents with  . Establish Care  . Diabetes    HPI [name] presents to establish care.  Diabetes-insulin-dependent, taking Lantus 20 units subcu daily.  Being followed by endocrinology but would like to have an endocrinologist closer to home.  Checks his sugars 2-3 times weekly with varying results ranging from 110 fasting to greater than 400 postprandial.  Elevated blood pressure-no previous history of hypertension.  Does not check blood pressure at home.  Positive family history of hypertension and stroke.  Endorses one episode of mild chest pain/discomfort 1 to 2 weeks ago that lasted for approximately 2 hours and spontaneously resolved.  No associated nausea, diaphoresis, shortness of breath.  Episode started while at rest, did not limit activity, has not recurred. Drinks around 3 energy drinks per day and eat a high sodium diet. Exercises regularly, tolerates cardiovascular exercise well.   Past Medical History:  Diagnosis Date  . Diabetes mellitus without complication [facility])     Past Surgical History:  Procedure Laterality Date  . CATARACT EXTRACTION    . REPAIR KNEE LIGAMENT      Family History  Problem Relation Age of Onset  . Diabetes Mother   . Diabetes Brother        type 2  . Diabetes Maternal Aunt   . Diabetes Maternal Grandfather   . Brain cancer Cousin   . Breast cancer Cousin     Social History   Socioeconomic History  . Marital status: Single    Spouse name: Not on file  . Number of children: Not on file  . Years of education: Not on file  . Highest education level: Not on file  Occupational History  . Not on file  Tobacco Use  . Smoking status: Former Smoker    Packs/day: 0.50    Quit date: 02/09/2019    Years since quitting: 0.2  . Smokeless tobacco: Never Used  Substance and Sexual Activity   . Alcohol use: Not Currently  . Drug use: No  . Sexual activity: Yes    Birth control/protection: Condom  Other Topics Concern  . Not on file  Social History Narrative  . Not on file   Social Determinants of Health   Financial Resource Strain:   . Difficulty of Paying Living Expenses:   Food Insecurity:   . Worried About Charity fundraiser in the Last Year:   . Arboriculturist in the Last Year:   Transportation Needs:   . Film/video editor (Medical):   Marland Kitchen Lack of Transportation (Non-Medical):   Physical Activity:   . Days of Exercise per Week:   . Minutes of Exercise per Session:   Stress:   . Feeling of Stress :   Social Connections:   . Frequency of Communication with Friends and Family:   . Frequency of Social Gatherings with Friends and Family:   . Attends Religious Services:   . Active Member of Clubs or Organizations:   . Attends Archivist Meetings:   Marland Kitchen Marital Status:   Intimate Partner Violence:   . Fear of Current or Ex-Partner:   . Emotionally Abused:   Marland Kitchen Physically Abused:   . Sexually Abused:     ROS Review of Systems  Constitutional: Negative for chills, fatigue, fever and unexpected weight change.  Eyes: Negative for visual disturbance.  Respiratory: Negative for cough, chest tightness and shortness of breath.   Cardiovascular: Positive for chest pain (see HPI). Negative for palpitations and leg swelling.  Gastrointestinal: Positive for diarrhea. Negative for abdominal pain, blood in stool, constipation, nausea and vomiting.  Endocrine: Negative for polydipsia, polyphagia and polyuria.  Neurological: Negative for dizziness, light-headedness and headaches.  Psychiatric/Behavioral: Positive for sleep disturbance (unusual schedule). Negative for self-injury and suicidal ideas. The patient is not nervous/anxious.     Objective:   Today's Vitals: BP (!) 141/95   Pulse 76   Temp 98.1 F (36.7 C) (Oral)   Ht 5' 9.75" (1.772 m)   Wt 167  lb 14.4 oz (76.2 kg)   SpO2 98%   BMI 24.26 kg/m   Physical Exam Vitals reviewed.  Constitutional:      General: He is not in acute distress.    Appearance: Normal appearance. He is normal weight.  HENT:     Head: Normocephalic and atraumatic.  Cardiovascular:     Rate and Rhythm: Normal rate and regular rhythm.     Pulses: Normal pulses.     Heart sounds: Normal heart sounds. No murmur. No gallop.   Pulmonary:     Effort: Pulmonary effort is normal. No respiratory distress.     Breath sounds: Normal breath sounds.  Abdominal:     General: Abdomen is flat. Bowel sounds are normal.     Palpations: Abdomen is soft.  Skin:    General: Skin is warm and dry.  Neurological:     Mental Status: He is alert and oriented to person, place, and time.  Psychiatric:        Mood and Affect: Mood normal.        Behavior: Behavior normal.        Thought Content: Thought content normal.        Judgment: Judgment normal.     Assessment & Plan:   1. Type 2 diabetes mellitus without complication, unspecified whether long term insulin use ([place]) Referring to endocrinology to establish relationship closer to home per patient request.  POCT hemoglobin A1c 9.9% today down from 14.2 on last record.  Checking CBC, CMP, and lipid panel today.  Discussed cardiovascular disease risk and prevention.  Pending lab results, may be beneficial to start ACE inhibitor and statin per recommendations. - POCT glycosylated hemoglobin (Hb A1C) - CBC - COMPLETE METABOLIC PANEL WITH GFR - Lipid panel - Ambulatory referral to Endocrinology  2. Elevated blood pressure reading in office without diagnosis of hypertension Discussed sodium intake and energy drink relation to elevated blood pressure and possible etiology of his chest discomfort.  Recommend low-sodium diet and limiting energy drinks to no more than 1/day.  Continue exercising regularly and maintain a healthy weight.  We will check blood pressure again in 2  weeks at a nurse visit to see if there is improvement with these measures. - CBC - COMPLETE METABOLIC PANEL WITH GFR - Lipid panel  Outpatient Encounter Medications as of 05/09/2019  Medication Sig  . glucose blood (ONETOUCH VERIO) test strip 1 each by Other route 2 (two) times daily. And lancets 2/day  . Insulin Glargine (LANTUS SOLOSTAR) 100 UNIT/ML Solostar Pen Inject 20 Units into the skin daily. And pen needles 1/day  . UNABLE TO FIND Med Name: Mobility Beast male testosterone enhancement supplement, 1 capsule three times daily  . UNABLE TO FIND Med Name: Manya Silvas Aid supplement for sperm count, 1 tablet twice a day  No facility-administered encounter medications on file as of 05/09/2019.    Follow-up: Return in about 2 weeks (around 05/23/2019) for nurse visit for BP check.   Clearnce Sorrel, DNP, APRN, FNP-BC [facility]

## 2019-05-10 MED ORDER — ATORVASTATIN CALCIUM 20 MG PO TABS: 20.0000 mg | ORAL_TABLET | Freq: Every day | ORAL | 3 refills | Status: DC

## 2019-05-10 MED ORDER — LOSARTAN POTASSIUM 25 MG PO TABS: 25.0000 mg | ORAL_TABLET | Freq: Every day | ORAL | 1 refills | Status: DC

## 2019-05-10 NOTE — Addendum Note (Signed)
Addended bySamuel Bouche on: 05/10/2019 12:08 PM   Modules accepted: Orders

## 2019-05-26 ENCOUNTER — Ambulatory Visit (INDEPENDENT_AMBULATORY_CARE_PROVIDER_SITE_OTHER): Payer: Managed Care, Other (non HMO) | Admitting: Medical-Surgical

## 2019-05-26 ENCOUNTER — Other Ambulatory Visit: Payer: Self-pay

## 2019-05-26 VITALS — BP 155/92 | HR 82

## 2019-05-26 DIAGNOSIS — I10 Essential (primary) hypertension: Secondary | ICD-10-CM

## 2019-05-26 NOTE — Progress Notes (Signed)
Patient comes in today for blood pressure check.   Hunter Villarreal was seen on 05/09/2019 and prescribed Losartan 25 mg daily for blood pressure control. He has not started taking blood pressure medication at this time. He hasn't been taking blood pressure readings at home.  His blood pressure reading today is: 155/92.  I advised patient to start blood pressure medication as prescribed and return in two weeks after starting medication for a blood pressure check with the nurse. He expressed understanding.

## 2019-06-09 ENCOUNTER — Ambulatory Visit: Payer: Managed Care, Other (non HMO)

## 2019-09-18 NOTE — Progress Notes (Signed)
   Complete physical exam  Patient: Hunter Villarreal   DOB: 10/26/1999   39 y.o. Male  MRN: [id number]  Subjective:    No chief complaint on file.   Hunter Villarreal is a 39 y.o. male who presents today for a complete physical exam. She reports consuming a {diet types:17450} diet. {types:19826} She generally feels {DESC; WELL/FAIRLY WELL/POORLY:18703}. She reports sleeping {DESC; WELL/FAIRLY WELL/POORLY:18703}. She {does/does not:200015} have additional problems to discuss today.    Most recent fall risk assessment:    07/03/2021   10:42 AM  Fall Risk   Falls in the past year? 0  Number falls in past yr: 0  Injury with Fall? 0  Risk for fall due to : No Fall Risks  Follow up Falls evaluation completed     Most recent depression screenings:    07/03/2021   10:42 AM 05/24/2020   10:46 AM  PHQ 2/9 Scores  PHQ - 2 Score 0 0  PHQ- 9 Score 5     {VISON DENTAL STD PSA (Optional):27386}  {History (Optional):23778}  Patient Care Team: Elsie Baynes, NP as PCP - General (Nurse Practitioner)   Outpatient Medications Prior to Visit  Medication Sig   fluticasone (FLONASE) 50 MCG/ACT nasal spray Place 2 sprays into both nostrils in the morning and at bedtime. After 7 days, reduce to once daily.   norgestimate-ethinyl estradiol (SPRINTEC 28) 0.25-35 MG-MCG tablet Take 1 tablet by mouth daily.   Nystatin POWD Apply liberally to affected area 2 times per day   spironolactone (ALDACTONE) 100 MG tablet Take 1 tablet (100 mg total) by mouth daily.   No facility-administered medications prior to visit.    ROS        Objective:     There were no vitals taken for this visit. {Vitals History (Optional):23777}  Physical Exam   No results found for any visits on 08/08/21. {Show previous labs (optional):23779}    Assessment & Plan:    Routine Health Maintenance and Physical Exam  Immunization History  Administered Date(s) Administered   DTaP 01/09/1999, 03/07/1999,  05/16/1999, 01/30/2000, 08/15/2003   Hepatitis A 06/11/2007, 06/16/2008   Hepatitis B 10/27/1998, 12/04/1998, 05/16/1999   HiB (PRP-OMP) 01/09/1999, 03/07/1999, 05/16/1999, 01/30/2000   IPV 01/09/1999, 03/07/1999, 11/04/1999, 08/15/2003   Influenza,inj,Quad PF,6+ Mos 09/16/2013   Influenza-Unspecified 12/17/2011   MMR 11/03/2000, 08/15/2003   Meningococcal Polysaccharide 06/16/2011   Pneumococcal Conjugate-13 01/30/2000   Pneumococcal-Unspecified 05/16/1999, 07/30/1999   Tdap 06/16/2011   Varicella 11/04/1999, 06/11/2007    Health Maintenance  Topic Date Due   HIV Screening  Never done   Hepatitis C Screening  Never done   INFLUENZA VACCINE  08/06/2021   PAP-Cervical Cytology Screening  08/08/2021 (Originally 10/26/2019)   PAP SMEAR-Modifier  08/08/2021 (Originally 10/26/2019)   TETANUS/TDAP  08/08/2021 (Originally 06/15/2021)   HPV VACCINES  Discontinued   COVID-19 Vaccine  Discontinued    Discussed health benefits of physical activity, and encouraged her to engage in regular exercise appropriate for her age and condition.  Problem List Items Addressed This Visit   None Visit Diagnoses     Annual physical exam    -  Primary   Cervical cancer screening       Need for Tdap vaccination          No follow-ups on file.     Teofila Bowery, NP   

## 2019-09-19 ENCOUNTER — Ambulatory Visit (INDEPENDENT_AMBULATORY_CARE_PROVIDER_SITE_OTHER): Payer: Managed Care, Other (non HMO) | Admitting: Medical-Surgical

## 2019-09-19 DIAGNOSIS — Z5329 Procedure and treatment not carried out because of patient's decision for other reasons: Secondary | ICD-10-CM

## 2019-11-02 ENCOUNTER — Ambulatory Visit: Payer: Managed Care, Other (non HMO) | Admitting: Medical-Surgical

## 2019-11-03 ENCOUNTER — Ambulatory Visit (INDEPENDENT_AMBULATORY_CARE_PROVIDER_SITE_OTHER): Payer: Managed Care, Other (non HMO) | Admitting: Medical-Surgical

## 2019-11-03 ENCOUNTER — Encounter: Payer: Self-pay | Admitting: Medical-Surgical

## 2019-11-03 VITALS — BP 134/89 | HR 78 | Temp 97.6°F | Ht 69.75 in | Wt 159.0 lb

## 2019-11-03 DIAGNOSIS — Z1159 Encounter for screening for other viral diseases: Secondary | ICD-10-CM

## 2019-11-03 DIAGNOSIS — E782 Mixed hyperlipidemia: Secondary | ICD-10-CM

## 2019-11-03 DIAGNOSIS — R030 Elevated blood-pressure reading, without diagnosis of hypertension: Secondary | ICD-10-CM

## 2019-11-03 DIAGNOSIS — N1831 Chronic kidney disease, stage 3a: Secondary | ICD-10-CM | POA: Diagnosis not present

## 2019-11-03 DIAGNOSIS — E119 Type 2 diabetes mellitus without complications: Secondary | ICD-10-CM | POA: Diagnosis not present

## 2019-11-03 DIAGNOSIS — I10 Essential (primary) hypertension: Secondary | ICD-10-CM

## 2019-11-03 DIAGNOSIS — Z23 Encounter for immunization: Secondary | ICD-10-CM | POA: Diagnosis not present

## 2019-11-03 LAB — POCT GLYCOSYLATED HEMOGLOBIN (HGB A1C): Hemoglobin A1C: 10.8 % — AB (ref 4.0–5.6)

## 2019-11-03 MED ORDER — ATORVASTATIN CALCIUM 20 MG PO TABS: 20.0000 mg | ORAL_TABLET | Freq: Every day | ORAL | 3 refills | Status: DC

## 2019-11-03 MED ORDER — LOSARTAN POTASSIUM 25 MG PO TABS: 25.0000 mg | ORAL_TABLET | Freq: Every day | ORAL | 1 refills | Status: DC

## 2019-11-03 NOTE — Patient Instructions (Addendum)
Diabetes Mellitus and Nutrition, Adult When you have diabetes (diabetes mellitus), it is very important to have healthy eating habits because your blood sugar (glucose) levels are greatly affected by what you eat and drink. Eating healthy foods in the appropriate amounts, at about the same times every day, can help you:  Control your blood glucose.  Lower your risk of heart disease.  Improve your blood pressure.  Reach or maintain a healthy weight. Every person with diabetes is different, and each person has different needs for a meal plan. Your health care provider may recommend that you work with a diet and nutrition specialist (dietitian) to make a meal plan that is best for you. Your meal plan may vary depending on factors such as:  The calories you need.  The medicines you take.  Your weight.  Your blood glucose, blood pressure, and cholesterol levels.  Your activity level.  Other health conditions you have, such as heart or kidney disease. How do carbohydrates affect me? Carbohydrates, also called carbs, affect your blood glucose level more than any other type of food. Eating carbs naturally raises the amount of glucose in your blood. Carb counting is a method for keeping track of how many carbs you eat. Counting carbs is important to keep your blood glucose at a healthy level, especially if you use insulin or take certain oral diabetes medicines. It is important to know how many carbs you can safely have in each meal. This is different for every person. Your dietitian can help you calculate how many carbs you should have at each meal and for each snack. Foods that contain carbs include:  Bread, cereal, rice, pasta, and crackers.  Potatoes and corn.  Peas, beans, and lentils.  Milk and yogurt.  Fruit and juice.  Desserts, such as cakes, cookies, ice cream, and candy. How does alcohol affect me? Alcohol can cause a sudden decrease in blood glucose (hypoglycemia),  especially if you use insulin or take certain oral diabetes medicines. Hypoglycemia can be a life-threatening condition. Symptoms of hypoglycemia (sleepiness, dizziness, and confusion) are similar to symptoms of having too much alcohol. If your health care provider says that alcohol is safe for you, follow these guidelines:  Limit alcohol intake to no more than 1 drink per day for nonpregnant women and 2 drinks per day for men. One drink equals 12 oz of beer, 5 oz of wine, or 1 oz of hard liquor.  Do not drink on an empty stomach.  Keep yourself hydrated with water, diet soda, or unsweetened iced tea.  Keep in mind that regular soda, juice, and other mixers may contain a lot of sugar and must be counted as carbs. What are tips for following this plan?  Reading food labels  Start by checking the serving size on the "Nutrition Facts" label of packaged foods and drinks. The amount of calories, carbs, fats, and other nutrients listed on the label is based on one serving of the item. Many items contain more than one serving per package.  Check the total grams (g) of carbs in one serving. You can calculate the number of servings of carbs in one serving by dividing the total carbs by 15. For example, if a food has 30 g of total carbs, it would be equal to 2 servings of carbs.  Check the number of grams (g) of saturated and trans fats in one serving. Choose foods that have low or no amount of these fats.  Check the number of  milligrams (mg) of salt (sodium) in one serving. Most people should limit total sodium intake to less than 2,300 mg per day.  Always check the nutrition information of foods labeled as "low-fat" or "nonfat". These foods may be higher in added sugar or refined carbs and should be avoided.  Talk to your dietitian to identify your daily goals for nutrients listed on the label. Shopping  Avoid buying canned, premade, or processed foods. These foods tend to be high in fat, sodium,  and added sugar.  Shop around the outside edge of the grocery store. This includes fresh fruits and vegetables, bulk grains, fresh meats, and fresh dairy. Cooking  Use low-heat cooking methods, such as baking, instead of high-heat cooking methods like deep frying.  Cook using healthy oils, such as olive, canola, or sunflower oil.  Avoid cooking with butter, cream, or high-fat meats. Meal planning  Eat meals and snacks regularly, preferably at the same times every day. Avoid going long periods of time without eating.  Eat foods high in fiber, such as fresh fruits, vegetables, beans, and whole grains. Talk to your dietitian about how many servings of carbs you can eat at each meal.  Eat 4-6 ounces (oz) of lean protein each day, such as lean meat, chicken, fish, eggs, or tofu. One oz of lean protein is equal to: ? 1 oz of meat, chicken, or fish. ? 1 egg. ?  cup of tofu.  Eat some foods each day that contain healthy fats, such as avocado, nuts, seeds, and fish. Lifestyle  Check your blood glucose regularly.  Exercise regularly as told by your health care provider. This may include: ? 150 minutes of moderate-intensity or vigorous-intensity exercise each week. This could be brisk walking, biking, or water aerobics. ? Stretching and doing strength exercises, such as yoga or weightlifting, at least 2 times a week.  Take medicines as told by your health care provider.  Do not use any products that contain nicotine or tobacco, such as cigarettes and e-cigarettes. If you need help quitting, ask your health care provider.  Work with a Social worker or diabetes educator to identify strategies to manage stress and any emotional and social challenges. Questions to ask a health care provider  Do I need to meet with a diabetes educator?  Do I need to meet with a dietitian?  What number can I call if I have questions?  When are the best times to check my blood glucose? Where to find more  information:  American Diabetes Association: diabetes.org  Academy of Nutrition and Dietetics: www.eatright.[employer] of Diabetes and Digestive and Kidney Diseases (NIH): [employer] Summary  A healthy meal plan will help you control your blood glucose and maintain a healthy lifestyle.  Working with a diet and nutrition specialist (dietitian) can help you make a meal plan that is best for you.  Keep in mind that carbohydrates (carbs) and alcohol have immediate effects on your blood glucose levels. It is important to count carbs and to use alcohol carefully. This information is not intended to replace advice given to you by your health care provider. Make sure you discuss any questions you have with your health care provider. Document Revised: 12/05/2016 Document Reviewed: 01/28/2016 Elsevier Patient Education  [place].   https://www.cdc.gov/vaccines/hcp/vis/vis-statements/tdap.pdf">  Tdap (Tetanus, Diphtheria, Pertussis) Vaccine: What You Need to Know 1. Why get vaccinated? Tdap vaccine can prevent tetanus, diphtheria, and pertussis. Diphtheria and pertussis spread from person to person. Tetanus enters the body through  cuts or wounds.  TETANUS (T) causes painful stiffening of the muscles. Tetanus can lead to serious health problems, including being unable to open the mouth, having trouble swallowing and breathing, or death.  DIPHTHERIA (D) can lead to difficulty breathing, heart failure, paralysis, or death.  PERTUSSIS (aP), also known as "whooping cough," can cause uncontrollable, violent coughing which makes it hard to breathe, eat, or drink. Pertussis can be extremely serious in babies and young children, causing pneumonia, convulsions, brain damage, or death. In teens and adults, it can cause weight loss, loss of bladder control, passing out, and rib fractures from severe coughing. 2. Tdap vaccine Tdap is only for children 7 years and older,  adolescents, and adults.  Adolescents should receive a single dose of Tdap, preferably at age 25 or 94 years. Pregnant women should get a dose of Tdap during every pregnancy, to protect the newborn from pertussis. Infants are most at risk for severe, life-threatening complications from pertussis. Adults who have never received Tdap should get a dose of Tdap. Also, adults should receive a booster dose every 10 years, or earlier in the case of a severe and dirty wound or burn. Booster doses can be either Tdap or Td (a different vaccine that protects against tetanus and diphtheria but not pertussis). Tdap may be given at the same time as other vaccines. 3. Talk with your health care provider Tell your vaccine provider if the person getting the vaccine:  Has had an allergic reaction after a previous dose of any vaccine that protects against tetanus, diphtheria, or pertussis, or has any severe, life-threatening allergies.  Has had a coma, decreased level of consciousness, or prolonged seizures within 7 days after a previous dose of any pertussis vaccine (DTP, DTaP, or Tdap).  Has seizures or another nervous system problem.  Has ever had Guillain-Barr Syndrome (also called GBS).  Has had severe pain or swelling after a previous dose of any vaccine that protects against tetanus or diphtheria. In some cases, your health care provider may decide to postpone Tdap vaccination to a future visit.  People with minor illnesses, such as a cold, may be vaccinated. People who are moderately or severely ill should usually wait until they recover before getting Tdap vaccine.  Your health care provider can give you more information. 4. Risks of a vaccine reaction  Pain, redness, or swelling where the shot was given, mild fever, headache, feeling tired, and nausea, vomiting, diarrhea, or stomachache sometimes happen after Tdap vaccine. People sometimes faint after medical procedures, including vaccination. Tell  your provider if you feel dizzy or have vision changes or ringing in the ears.  As with any medicine, there is a very remote chance of a vaccine causing a severe allergic reaction, other serious injury, or death. 5. What if there is a serious problem? An allergic reaction could occur after the vaccinated person leaves the clinic. If you see signs of a severe allergic reaction (hives, swelling of the face and throat, difficulty breathing, a fast heartbeat, dizziness, or weakness), call 9-1-1 and get the person to the nearest hospital. For other signs that concern you, call your health care provider.  Adverse reactions should be reported to the Vaccine Adverse Event Reporting System (VAERS). Your health care provider will usually file this report, or you can do it yourself. Visit the VAERS website at www.vaers.SamedayNews.es or call 518-418-2098. VAERS is only for reporting reactions, and VAERS staff do not give medical advice. 6. The National Vaccine Injury [employer]  The [employer] Injury Compensation Program (VICP) is a federal program that was created to compensate people who may have been injured by certain vaccines. Visit the VICP website at GoldCloset.com.ee or call (437) 479-0135 to learn about the program and about filing a claim. There is a time limit to file a claim for compensation. 7. How can I learn more?  Ask your health care provider.  Call your local or state health department.  Contact the Centers for Disease Control and Prevention (CDC): ? Call 213-813-7181 (1-800-CDC-INFO) or ? Visit CDC's website at http://hunter.com/ Vaccine Information Statement Tdap (Tetanus, Diphtheria, Pertussis) Vaccine (04/07/2018) This information is not intended to replace advice given to you by your health care provider. Make sure you discuss any questions you have with your health care provider. Document Revised: 04/16/2018 Document Reviewed: 04/19/2018 Elsevier Patient  Education  [place].

## 2019-11-03 NOTE — Progress Notes (Signed)
Subjective:    CC: DM/HTN follow up  HPI: Pleasant 39 year old male presenting today for follow up on diabetes and hypertension. He checks his blood sugars most days but admits to missing some. His fasting sugars have been as low as 120 but are often higher. Notes that he was sick not long ago and could not get his sugars below 200. Lowest sugar 55, symptomatic. Feels bad when his sugars get below 150. Prescribed Lantus 20 units nightly but only takes 16 units on nights he has to work or when he knows he will be very active that day. If he is going to be more sedentary, he will take the whole 20 units. Has tried Metformin, Glipizide, Tradjenta, Has difficulty eating right, especially at work where he doesn't have time to leave and find healthy foods. His girlfriend cooks at home but she and the kids don't like to eat the foods that are compliant with his diabetic diet. He does exercise regularly and tries to stay in shape. He did not start the Losartan or the Atorvastatin that was prescribed in May. Denies wounds, frequent infections, fevers, chills, headaches, and vision changes. Saw Dr. Loanne Drilling previously with endocrinology but needs a place closer to home in [facility].   HTN- Does not check his blood pressure at home. Not taking any medications. Denies CP, SOB, palpitations, lower extremity edema, dizziness, headaches, or vision changes.  I reviewed the past medical history, family history, social history, surgical history, and allergies today and no changes were needed.  Please see the problem list section below in epic for further details.  Past Medical History: Past Medical History:  Diagnosis Date  . Diabetes mellitus without complication [facility])    Past Surgical History: Past Surgical History:  Procedure Laterality Date  . CATARACT EXTRACTION    . REPAIR KNEE LIGAMENT     Social History: Social History   Socioeconomic History  . Marital status: Single    Spouse name: Not on  file  . Number of children: Not on file  . Years of education: Not on file  . Highest education level: Not on file  Occupational History  . Not on file  Tobacco Use  . Smoking status: Former Smoker    Packs/day: 0.50    Quit date: 02/09/2019    Years since quitting: 0.7  . Smokeless tobacco: Never Used  Vaping Use  . Vaping Use: Every day  . Substances: Nicotine  Substance and Sexual Activity  . Alcohol use: Not Currently  . Drug use: No  . Sexual activity: Yes    Birth control/protection: Condom  Other Topics Concern  . Not on file  Social History Narrative  . Not on file   Social Determinants of Health   Financial Resource Strain:   . Difficulty of Paying Living Expenses: Not on file  Food Insecurity:   . Worried About Charity fundraiser in the Last Year: Not on file  . Ran Out of Food in the Last Year: Not on file  Transportation Needs:   . Lack of Transportation (Medical): Not on file  . Lack of Transportation (Non-Medical): Not on file  Physical Activity:   . Days of Exercise per Week: Not on file  . Minutes of Exercise per Session: Not on file  Stress:   . Feeling of Stress : Not on file  Social Connections:   . Frequency of Communication with Friends and Family: Not on file  . Frequency of Social Gatherings with Friends  and Family: Not on file  . Attends Religious Services: Not on file  . Active Member of Clubs or Organizations: Not on file  . Attends Archivist Meetings: Not on file  . Marital Status: Not on file   Family History: Family History  Problem Relation Age of Onset  . Diabetes Mother   . Diabetes Brother        type 2  . Diabetes Maternal Aunt   . Diabetes Maternal Grandfather   . Brain cancer Cousin   . Breast cancer Cousin    Allergies: No Known Allergies Medications: See med rec.  Review of Systems: See HPI for pertinent positives and negatives.   Objective:    General: Well Developed, well nourished, and in no acute  distress.  Neuro: Alert and oriented x3.  HEENT: Normocephalic, atraumatic.  Skin: Warm and dry. Cardiac: Regular rate and rhythm, no murmurs rubs or gallops, no lower extremity edema.  Respiratory: Clear to auscultation bilaterally. Not using accessory muscles, speaking in full sentences.  Impression and Recommendations:    1. Type 2 diabetes mellitus without complication, unspecified whether long term insulin use (HCC) POCT HgbA1c 10.8% today up from 9.9%. Continue Lantus 20 units nightly. Discussed importance of dietary modifications. Referring to endocrinology in [facility]. Discussed the recommendation for a statin medication for heart protection in diabetes. Patient reports he will start taking the atorvastatin 20mg  daily. Re-sent prescription.  - POCT glycosylated hemoglobin (Hb A1C)  2. Need for Tdap vaccination Tdap given in office.  - Tdap vaccine greater than or equal to 7yo IM  3. Need for hepatitis C screening test Deferring today.  4. Chronic renal failure, stage 3a (HCC) Checking CMP.   5. Essential hypertension Checking CBC and CMP. Strongly recommend starting Losartan 25mg  daily as discussed for BP lowering and kidney/heart protection. Re-sent prescription. Patient verbalized he will start taking it.   Return in about 3 months (around 02/03/2020) for DM/HTN/HLD follow up. ___________________________________________ Clearnce Sorrel, DNP, APRN, FNP-BC [facility] [place]

## 2019-11-04 LAB — COMPLETE METABOLIC PANEL WITH GFR
AG Ratio: 1.4 (calc) (ref 1.0–2.5)
ALT: 21 U/L (ref 9–46)
AST: 21 U/L (ref 10–40)
Albumin: 3.5 g/dL — ABNORMAL LOW (ref 3.6–5.1)
Alkaline phosphatase (APISO): 75 U/L (ref 36–130)
BUN/Creatinine Ratio: 21 (calc) (ref 6–22)
BUN: 36 mg/dL — ABNORMAL HIGH (ref 7–25)
CO2: 29 mmol/L (ref 20–32)
Calcium: 8.4 mg/dL — ABNORMAL LOW (ref 8.6–10.3)
Chloride: 107 mmol/L (ref 98–110)
Creat: 1.73 mg/dL — ABNORMAL HIGH (ref 0.60–1.35)
GFR, Est African American: 56 mL/min/{1.73_m2} — ABNORMAL LOW (ref >=60)
GFR, Est Non African American: 48 mL/min/{1.73_m2} — ABNORMAL LOW (ref >=60)
Globulin: 2.5 g/dL (calc) (ref 1.9–3.7)
Glucose, Bld: 305 mg/dL — ABNORMAL HIGH (ref 65–139)
Potassium: 4.7 mmol/L (ref 3.5–5.3)
Sodium: 140 mmol/L (ref 135–146)
Total Bilirubin: 0.4 mg/dL (ref 0.2–1.2)
Total Protein: 6 g/dL — ABNORMAL LOW (ref 6.1–8.1)

## 2019-11-04 LAB — CBC
HCT: 36.4 % — ABNORMAL LOW (ref 38.5–50.0)
Hemoglobin: 11.7 g/dL — ABNORMAL LOW (ref 13.2–17.1)
MCH: 28.3 pg (ref 27.0–33.0)
MCHC: 32.1 g/dL (ref 32.0–36.0)
MCV: 88.1 fL (ref 80.0–100.0)
MPV: 10.8 fL (ref 7.5–12.5)
Platelets: 241 10*3/uL (ref 140–400)
RBC: 4.13 10*6/uL — ABNORMAL LOW (ref 4.20–5.80)
RDW: 12.4 % (ref 11.0–15.0)
WBC: 4.6 10*3/uL (ref 3.8–10.8)

## 2020-02-06 ENCOUNTER — Telehealth: Payer: Self-pay | Admitting: Medical-Surgical

## 2020-02-06 ENCOUNTER — Ambulatory Visit: Payer: Managed Care, Other (non HMO) | Admitting: Medical-Surgical

## 2020-02-06 DIAGNOSIS — E119 Type 2 diabetes mellitus without complications: Secondary | ICD-10-CM

## 2020-02-06 DIAGNOSIS — E782 Mixed hyperlipidemia: Secondary | ICD-10-CM

## 2020-02-06 DIAGNOSIS — I10 Essential (primary) hypertension: Secondary | ICD-10-CM

## 2020-02-06 NOTE — Telephone Encounter (Signed)
Please contact patient: He has had 3 No Show appointments in the last 6 months. I understand that he has several responsibilities but any further no shows will result in dismissal per the practice attendance policy.

## 2020-04-02 ENCOUNTER — Other Ambulatory Visit: Payer: Self-pay

## 2020-04-02 ENCOUNTER — Encounter: Payer: Self-pay | Admitting: Medical-Surgical

## 2020-04-02 ENCOUNTER — Ambulatory Visit (INDEPENDENT_AMBULATORY_CARE_PROVIDER_SITE_OTHER): Payer: 59 | Admitting: Medical-Surgical

## 2020-04-02 VITALS — BP 162/90 | HR 88 | Temp 98.1°F | Ht 69.75 in | Wt 161.5 lb

## 2020-04-02 DIAGNOSIS — E782 Mixed hyperlipidemia: Secondary | ICD-10-CM | POA: Insufficient documentation

## 2020-04-02 DIAGNOSIS — I10 Essential (primary) hypertension: Secondary | ICD-10-CM

## 2020-04-02 DIAGNOSIS — Z1159 Encounter for screening for other viral diseases: Secondary | ICD-10-CM | POA: Diagnosis not present

## 2020-04-02 DIAGNOSIS — E119 Type 2 diabetes mellitus without complications: Secondary | ICD-10-CM | POA: Diagnosis not present

## 2020-04-02 LAB — POCT GLYCOSYLATED HEMOGLOBIN (HGB A1C): Hemoglobin A1C: 13 % — AB (ref 4.0–5.6)

## 2020-04-02 MED ORDER — LANTUS SOLOSTAR 100 UNIT/ML ~~LOC~~ SOPN: 20.0000 [IU] | PEN_INJECTOR | Freq: Every day | SUBCUTANEOUS | 11 refills | Status: DC

## 2020-04-02 MED ORDER — SEMAGLUTIDE 7 MG PO TABS: 1.0000 | ORAL_TABLET | Freq: Every day | ORAL | 0 refills | Status: AC

## 2020-04-02 MED ORDER — VALSARTAN 80 MG PO TABS: 80.0000 mg | ORAL_TABLET | Freq: Every day | ORAL | 1 refills | Status: DC

## 2020-04-02 NOTE — Progress Notes (Signed)
Subjective:    CC: DM/HTN/HLD follow-up  HPI: Pleasant 40 year old male presenting today for follow-up on diabetes, hypertension, and hyperlipidemia.  Diabetes-he is not regularly checking his blood sugars at home but does on occasion with readings in the 200s.  He is taking Lantus 20 units nightly but does admit that he has not been taking this regularly and does miss doses.  He was seeing endocrinology at the end of 2020 but has not been seen by them since then.  Hypertension-taking losartan 25 mg daily, missing doses at times but has been a little better at this than his insulin.  Does not check blood pressures at home.  HLD-taking atorvastatin 20 mg daily.  I reviewed the past medical history, family history, social history, surgical history, and allergies today and no changes were needed.  Please see the problem list section below in epic for further details.  Past Medical History: Past Medical History:  Diagnosis Date  . Diabetes mellitus without complication [facility])    Past Surgical History: Past Surgical History:  Procedure Laterality Date  . CATARACT EXTRACTION    . REPAIR KNEE LIGAMENT     Social History: Social History   Socioeconomic History  . Marital status: Single    Spouse name: Not on file  . Number of children: Not on file  . Years of education: Not on file  . Highest education level: Not on file  Occupational History  . Not on file  Tobacco Use  . Smoking status: Former Smoker    Packs/day: 0.50    Quit date: 02/09/2019    Years since quitting: 1.1  . Smokeless tobacco: Never Used  Vaping Use  . Vaping Use: Every day  . Substances: Nicotine  Substance and Sexual Activity  . Alcohol use: Not Currently  . Drug use: No  . Sexual activity: Yes    Birth control/protection: Condom  Other Topics Concern  . Not on file  Social History Narrative  . Not on file   Social Determinants of Health   Financial Resource Strain: Not on file  Food Insecurity:  Not on file  Transportation Needs: Not on file  Physical Activity: Not on file  Stress: Not on file  Social Connections: Not on file   Family History: Family History  Problem Relation Age of Onset  . Diabetes Mother   . Diabetes Brother        type 2  . Diabetes Maternal Aunt   . Diabetes Maternal Grandfather   . Brain cancer Cousin   . Breast cancer Cousin    Allergies: No Known Allergies Medications: See med rec.  Review of Systems: See HPI for pertinent positives and negatives.   Objective:    General: Well Developed, well nourished, and in no acute distress.  Neuro: Alert and oriented x3.  HEENT: Normocephalic, atraumatic.  Skin: Warm and dry. Cardiac: Regular rate and rhythm, no murmurs rubs or gallops, no lower extremity edema.  Respiratory: Clear to auscultation bilaterally. Not using accessory muscles, speaking in full sentences.  Impression and Recommendations:    1. Type 2 diabetes mellitus without complication, without long-term current use of insulin (HCC) Point-of-care hemoglobin A1c 13% today.  Checking CBC and CMP.  In the setting of known renal insufficiency, concern for worsening kidney function.  Discussed risks for kidney failure as well as other systemic effects with uncontrolled diabetes.  Starting Rybelsus 7 mg daily if insurance will cover it.  Discount card provided to patient.  Strongly advised checking glucose  at least once daily and as needed for hyper/hypoglycemic symptoms. - CBC with Differential/Platelet - COMPLETE METABOLIC PANEL WITH GFR - POCT glycosylated hemoglobin (Hb A1C)  2. Essential hypertension Checking labs.  Switching from losartan to valsartan for better blood pressure control. - CBC with Differential/Platelet - COMPLETE METABOLIC PANEL WITH GFR  3. Mixed hyperlipidemia Continue a atorvastatin 20 mg daily. - Lipid panel  4. Need for hepatitis C screening test Discussed quitting recommendations.  Adding to blood work  today - Hepatitis C antibody  Return in about 2 weeks (around 04/16/2020) for nurse visit for BP check. ___________________________________________ Clearnce Sorrel, DNP, APRN, FNP-BC [facility] [place]

## 2020-04-03 ENCOUNTER — Telehealth: Payer: Self-pay

## 2020-04-03 LAB — LIPID PANEL
Cholesterol: 232 mg/dL — ABNORMAL HIGH (ref <200)
HDL: 88 mg/dL (ref >=40)
LDL Cholesterol (Calc): 130 mg/dL (calc) — ABNORMAL HIGH
Non-HDL Cholesterol (Calc): 144 mg/dL (calc) — ABNORMAL HIGH (ref <130)
Total CHOL/HDL Ratio: 2.6 (calc) (ref <5.0)
Triglycerides: 52 mg/dL (ref <150)

## 2020-04-03 LAB — HEPATITIS C ANTIBODY
Hepatitis C Ab: NONREACTIVE
SIGNAL TO CUT-OFF: 0.01 (ref <1.00)

## 2020-04-03 LAB — COMPLETE METABOLIC PANEL WITH GFR
AG Ratio: 1.4 (calc) (ref 1.0–2.5)
ALT: 20 U/L (ref 9–46)
AST: 18 U/L (ref 10–40)
Albumin: 3.8 g/dL (ref 3.6–5.1)
Alkaline phosphatase (APISO): 74 U/L (ref 36–130)
BUN/Creatinine Ratio: 14 (calc) (ref 6–22)
BUN: 24 mg/dL (ref 7–25)
CO2: 31 mmol/L (ref 20–32)
Calcium: 8.9 mg/dL (ref 8.6–10.3)
Chloride: 106 mmol/L (ref 98–110)
Creat: 1.77 mg/dL — ABNORMAL HIGH (ref 0.60–1.35)
GFR, Est African American: 54 mL/min/{1.73_m2} — ABNORMAL LOW (ref >=60)
GFR, Est Non African American: 47 mL/min/{1.73_m2} — ABNORMAL LOW (ref >=60)
Globulin: 2.8 g/dL (calc) (ref 1.9–3.7)
Glucose, Bld: 286 mg/dL — ABNORMAL HIGH (ref 65–139)
Potassium: 5 mmol/L (ref 3.5–5.3)
Sodium: 143 mmol/L (ref 135–146)
Total Bilirubin: 0.4 mg/dL (ref 0.2–1.2)
Total Protein: 6.6 g/dL (ref 6.1–8.1)

## 2020-04-03 LAB — CBC WITH DIFFERENTIAL/PLATELET
Absolute Monocytes: 311 cells/uL (ref 200–950)
Basophils Absolute: 42 cells/uL (ref 0–200)
Basophils Relative: 1 %
Eosinophils Absolute: 130 cells/uL (ref 15–500)
Eosinophils Relative: 3.1 %
HCT: 37.3 % — ABNORMAL LOW (ref 38.5–50.0)
Hemoglobin: 12.1 g/dL — ABNORMAL LOW (ref 13.2–17.1)
Lymphs Abs: 1243 cells/uL (ref 850–3900)
MCH: 28.6 pg (ref 27.0–33.0)
MCHC: 32.4 g/dL (ref 32.0–36.0)
MCV: 88.2 fL (ref 80.0–100.0)
MPV: 10.3 fL (ref 7.5–12.5)
Monocytes Relative: 7.4 %
Neutro Abs: 2474 cells/uL (ref 1500–7800)
Neutrophils Relative %: 58.9 %
Platelets: 320 10*3/uL (ref 140–400)
RBC: 4.23 10*6/uL (ref 4.20–5.80)
RDW: 12.5 % (ref 11.0–15.0)
Total Lymphocyte: 29.6 %
WBC: 4.2 10*3/uL (ref 3.8–10.8)

## 2020-04-03 NOTE — Telephone Encounter (Signed)
Patient aware of recommendations and verbalized understanding. No further questions or concerns at this time.

## 2020-04-03 NOTE — Telephone Encounter (Signed)
Pt had an OV yesterday and was started on semaglutide 7 mg and was switched to valsartan 80 mg. He said that he normally takes all of his meds at one time. Pt states that yesterday he took all of his meds at 6:00 PM and by 8:00 PM he was having severe abd pain and vomiting. I asked if he took the meds on an empty stomach and he said he had a sub sandwich around 3:00-4:00 PM and just before he took his meds he had a small snack. Please advise

## 2020-04-03 NOTE — Telephone Encounter (Signed)
It's likely the rybelsus that caused his GI issues. He can try breaking the tablet in half and taking half for a week or two before trying the higher dose again. If still unable to tolerate the half dose, have him call us back. Try taking it with food, separately from his other medications. He may still have some nausea at first but it should be mild and will usually resolve after the first few days.

## 2020-04-16 ENCOUNTER — Ambulatory Visit: Payer: 59

## 2020-06-04 ENCOUNTER — Telehealth: Payer: Self-pay

## 2020-06-04 NOTE — Telephone Encounter (Signed)
Transition Care Management Follow-up Telephone Call  Date of discharge and from where: 06/01/2020 from [facility]  How have you been since you were released from the hospital? Pt stated that he if feeling okay. Pt is checking his sugar at home.   Any questions or concerns? No  Items Reviewed:  Did the pt receive and understand the discharge instructions provided? Yes   Medications obtained and verified? Yes   Other? No   Any new allergies since your discharge? No   Dietary orders reviewed? DM   Do you have support at home? Yes   Functional Questionnaire: (I = Independent and D = Dependent) ADLs: I  Bathing/Dressing- I  Meal Prep- I  Eating- I  Maintaining continence- I  Transferring/Ambulation- I  Managing Meds- I   Follow up appointments reviewed:   PCP Hospital f/u appt confirmed? No  Calling PCP for a follow up appt.  [facility] f/u appt confirmed? No    Are transportation arrangements needed? No   If their condition worsens, is the pt aware to call PCP or go to the Emergency Dept.? Yes  Was the patient provided with contact information for the PCP's office or ED? Yes  Was to pt encouraged to call back with questions or concerns? Yes

## 2020-08-28 ENCOUNTER — Emergency Department (INDEPENDENT_AMBULATORY_CARE_PROVIDER_SITE_OTHER)
Admission: EM | Admit: 2020-08-28 | Discharge: 2020-08-28 | Disposition: A | Discharge status: Home / Self Care | Payer: 59 | Source: Home / Self Care

## 2020-08-28 ENCOUNTER — Other Ambulatory Visit: Payer: Self-pay

## 2020-08-28 DIAGNOSIS — B029 Zoster without complications: Secondary | ICD-10-CM | POA: Diagnosis not present

## 2020-08-28 HISTORY — DX: Essential (primary) hypertension: I10

## 2020-08-28 MED ORDER — VALACYCLOVIR HCL 1 G PO TABS: 1000.0000 mg | ORAL_TABLET | Freq: Three times a day (TID) | ORAL | 0 refills | Status: DC

## 2020-08-28 NOTE — Discharge Instructions (Addendum)
Advised/instructed patient to take medication as directed with food to completion.  Encouraged patient to increase daily water intake while taking this medication.

## 2020-08-28 NOTE — ED Triage Notes (Signed)
Pt presents to Urgent Care with c/o rash to L chest and L upper back x 3-4 days. Reports areas are tender to touch.

## 2020-08-28 NOTE — ED Provider Notes (Signed)
[facility]    CSN: [id number] Arrival date & time: 08/28/20  1049      History   Chief Complaint Chief Complaint  Patient presents with   Rash    HPI Hunter Villarreal is a 40 y.o. male.   HPI 40 year old male presents with possible shingles rash for 2 days, reports first noticing painful vesicular rash early Sunday afternoon.  Past Medical History:  Diagnosis Date   Diabetes mellitus without complication ([place])    Hypertension     Patient Active Problem List   Diagnosis Date Noted   Mixed hyperlipidemia 04/02/2020   Essential hypertension 05/26/2019   Diabetes ([place]) 12/07/2015    Past Surgical History:  Procedure Laterality Date   CATARACT EXTRACTION     REPAIR KNEE LIGAMENT         Home Medications    Prior to Admission medications   Medication Sig Start Date End Date Taking? Authorizing Provider  valACYclovir (VALTREX) 1000 MG tablet Take 1 tablet (1,000 mg total) by mouth 3 (three) times daily. 08/28/20  Yes Eliezer Lofts, FNP  atorvastatin (LIPITOR) 20 MG tablet Take 1 tablet (20 mg total) by mouth daily. 11/03/19   Samuel Bouche, NP  glucose blood (ONETOUCH VERIO) test strip 1 each by Other route 2 (two) times daily. And lancets 2/day 12/21/18   Renato Shin, MD  insulin glargine (LANTUS SOLOSTAR) 100 UNIT/ML Solostar Pen Inject 20 Units into the skin daily. And pen needles 1/day 04/02/20   Samuel Bouche, NP  valsartan (DIOVAN) 80 MG tablet Take 1 tablet (80 mg total) by mouth daily. 04/02/20   Samuel Bouche, NP    Family History Family History  Problem Relation Age of Onset   Diabetes Mother    Diabetes Brother        type 2   Diabetes Maternal Grandfather    Diabetes Maternal Aunt    Brain cancer Cousin    Breast cancer Cousin     Social History Social History   Tobacco Use   Smoking status: Former    Packs/day: 0.50    Types: Cigarettes    Quit date: 02/09/2019    Years since quitting: 1.5   Smokeless tobacco: Never  Vaping Use    Vaping Use: Every day   Substances: Nicotine  Substance Use Topics   Alcohol use: Yes    Comment: rarely   Drug use: No     Allergies   Patient has no known allergies.   Review of Systems Review of Systems  Skin:  Positive for rash.  All other systems reviewed and are negative.   Physical Exam Triage Vital Signs ED Triage Vitals  Enc Vitals Group     BP      Pulse      Resp      Temp      Temp src      SpO2      Weight      Height      Head Circumference      Peak Flow      Pain Score      Pain Loc      Pain Edu?      Excl. in [place]?    No data found.  Updated Vital Signs BP (!) 151/97 (BP Location: Right Arm)   Pulse 85   Temp 98.2 F (36.8 C) (Oral)   Resp 18   Ht '5\' 11"'$  (1.803 m)   Wt 160 lb (72.6 kg)  SpO2 98%   BMI 22.32 kg/m    Physical Exam Vitals and nursing note reviewed.  Constitutional:      General: He is not in acute distress.    Appearance: Normal appearance. He is normal weight. He is not ill-appearing.  HENT:     Head: Normocephalic and atraumatic.     Mouth/Throat:     Mouth: Mucous membranes are moist.     Pharynx: Oropharynx is clear.  Eyes:     Extraocular Movements: Extraocular movements intact.     Conjunctiva/sclera: Conjunctivae normal.     Pupils: Pupils are equal, round, and reactive to light.  Cardiovascular:     Rate and Rhythm: Normal rate and regular rhythm.     Pulses: Normal pulses.     Heart sounds: Normal heart sounds. No murmur heard. Pulmonary:     Effort: Pulmonary effort is normal.     Breath sounds: Normal breath sounds. No wheezing, rhonchi or rales.  Musculoskeletal:        General: Normal range of motion.     Cervical back: Normal range of motion and neck supple. No tenderness.  Lymphadenopathy:     Cervical: No cervical adenopathy.  Skin:    General: Skin is warm and dry.     Comments: Left upper chest/left upper back: Multiple erythematous grouped painful vesicular lesions noted  Neurological:      General: No focal deficit present.     Mental Status: He is alert and oriented to person, place, and time. Mental status is at baseline.  Psychiatric:        Mood and Affect: Mood normal.        Behavior: Behavior normal.        Thought Content: Thought content normal.     UC Treatments / Results  Labs (all labs ordered are listed, but only abnormal results are displayed) Labs Reviewed - No data to display  EKG   Radiology No results found.  Procedures Procedures (including critical care time)  Medications Ordered in UC Medications - No data to display  Initial Impression / Assessment and Plan / UC Course  I have reviewed the triage vital signs and the nursing notes.  Pertinent labs & imaging results that were available during my care of the patient were reviewed by me and considered in my medical decision making (see chart for details).     MDM: 1.  Herpes zoster without complication-Rx'd Valtrex. Advised/instructed patient to take medication as directed with food to completion.  Encouraged patient to increase daily water intake while taking this medication.  Patient discharged home, hemodynamically stable. Final Clinical Impressions(s) / UC Diagnoses   Final diagnoses:  Herpes zoster without complication     Discharge Instructions      Advised/instructed patient to take medication as directed with food to completion.  Encouraged patient to increase daily water intake while taking this medication.     ED Prescriptions     Medication Sig Dispense Auth. Provider   valACYclovir (VALTREX) 1000 MG tablet Take 1 tablet (1,000 mg total) by mouth 3 (three) times daily. 21 tablet Eliezer Lofts, FNP      PDMP not reviewed this encounter.   Eliezer Lofts, [place] 08/28/20 1140

## 2020-08-31 ENCOUNTER — Encounter: Payer: 59 | Admitting: Medical-Surgical

## 2020-09-03 ENCOUNTER — Ambulatory Visit (INDEPENDENT_AMBULATORY_CARE_PROVIDER_SITE_OTHER): Payer: 59 | Admitting: Medical-Surgical

## 2020-09-03 ENCOUNTER — Encounter: Payer: Self-pay | Admitting: Medical-Surgical

## 2020-09-03 ENCOUNTER — Other Ambulatory Visit: Payer: Self-pay

## 2020-09-03 ENCOUNTER — Other Ambulatory Visit: Payer: Self-pay | Admitting: Medical-Surgical

## 2020-09-03 VITALS — BP 152/87 | HR 83 | Temp 97.9°F | Ht 71.0 in | Wt 166.9 lb

## 2020-09-03 DIAGNOSIS — B029 Zoster without complications: Secondary | ICD-10-CM

## 2020-09-03 DIAGNOSIS — E1159 Type 2 diabetes mellitus with other circulatory complications: Secondary | ICD-10-CM | POA: Diagnosis not present

## 2020-09-03 DIAGNOSIS — I10 Essential (primary) hypertension: Secondary | ICD-10-CM

## 2020-09-03 DIAGNOSIS — Z794 Long term (current) use of insulin: Secondary | ICD-10-CM | POA: Diagnosis not present

## 2020-09-03 DIAGNOSIS — E782 Mixed hyperlipidemia: Secondary | ICD-10-CM | POA: Diagnosis not present

## 2020-09-03 LAB — POCT GLYCOSYLATED HEMOGLOBIN (HGB A1C): Hemoglobin A1C: 8.3 % — AB (ref 4.0–5.6)

## 2020-09-03 MED ORDER — ATORVASTATIN CALCIUM 20 MG PO TABS: 20.0000 mg | ORAL_TABLET | Freq: Every day | ORAL | 1 refills | Status: DC

## 2020-09-03 MED ORDER — LIDOCAINE 5 % EX OINT: 1.0000 "application " | TOPICAL_OINTMENT | Freq: Three times a day (TID) | CUTANEOUS | 1 refills | Status: DC | PRN

## 2020-09-03 NOTE — Progress Notes (Signed)
  HPI with pertinent ROS:   CC: DM follow up  HPI: Pleasant 40 year old male presenting today for the following:  Diabetes-using Lantus 20 units daily although does alter his regimen.  About 3-4 times weekly, he is doing regular exercise and eating a high-protein diet.  On his workout days, he takes 0-10 units of Lantus.  He is checking blood sugars at home with a range of 60s to 180s.  He did recently have an elevation to the 300s when he developed the rash he was seen in urgent care for.  Notes that his peripheral neuropathy symptoms have improved greatly with his recent exercise and dietary efforts.  Hypertension-takes valsartan 80 mg daily, did not have his dose this morning.  Tolerating the medication well without side effects.  He does not check his blood pressure at home and does not have a blood pressure cuff. Denies CP, SOB, palpitations, lower extremity edema, dizziness, headaches, or vision changes.  Rash-was seen in urgent care and diagnosed with shingles.  He has been taking valacyclovir 1000 mg 3 times daily.  He did miss a dose this morning but he has doses to finish today and tomorrow.  Notes the rash is intermittently bothersome and will randomly start itching and burning.  This is worse at night when he is working and getting sweaty.  I reviewed the past medical history, family history, social history, surgical history, and allergies today and no changes were needed.  Please see the problem list section below in epic for further details.   Physical exam:   General: Well Developed, well nourished, and in no acute distress.  Neuro: Alert and oriented x3.  HEENT: Normocephalic, atraumatic.  Skin: Warm and dry.  Partially healed vesicular rash noted to the left chest just inferior and lateral to the nipple with a secondary patch to the lower part of the left scapula. Cardiac: Regular rate and rhythm, no murmurs rubs or gallops, no lower extremity edema.  Respiratory: Clear to  auscultation bilaterally. Not using accessory muscles, speaking in full sentences.  Impression and Recommendations:    1. Type 2 diabetes mellitus with other circulatory complication, with long-term current use of insulin (HCC) POCT hemoglobin A1c 8.3% today, down from 13% at last check.  Continue Lantus 20 units nightly.  Continue exercise 3-4 times weekly and dietary modifications.  Work to limit simple carbohydrates and concentrated sweets. - POCT glycosylated hemoglobin (Hb A1C)  2. Mixed hyperlipidemia Continue atorvastatin 20 mg daily.  Checking lipid panel today - Lipid panel - atorvastatin (LIPITOR) 20 MG tablet; Take 1 tablet (20 mg total) by mouth daily.  Dispense: 90 tablet; Refill: 1  3. Essential hypertension Checking CBC with differential and CMP.  He did miss his dose of medicines today so his blood pressure is somewhat elevated.  Recommend taking medication daily and working to check blood pressures at home.  Advised to get a blood pressure cuff that measures on the arm rather than the wrist so we know its more accurate.  I will send him a MyChart message and/or give him a call in 2 weeks to see how his blood pressures are running. - CBC with Differential/Platelet - COMPLETE METABOLIC PANEL WITH GFR  4. Herpes zoster without complication Continue Valtrex as ordered.  Sending in lidocaine ointment topically 3 times daily as needed.  Return in about 3 months (around 12/04/2020) for DM/HTN/HLD follow up. ___________________________________________ Clearnce Sorrel, DNP, APRN, FNP-BC [facility] [place]

## 2020-09-04 LAB — COMPLETE METABOLIC PANEL WITH GFR
AG Ratio: 1.4 (calc) (ref 1.0–2.5)
ALT: 24 U/L (ref 9–46)
AST: 21 U/L (ref 10–40)
Albumin: 3.4 g/dL — ABNORMAL LOW (ref 3.6–5.1)
Alkaline phosphatase (APISO): 61 U/L (ref 36–130)
BUN/Creatinine Ratio: 20 (calc) (ref 6–22)
BUN: 40 mg/dL — ABNORMAL HIGH (ref 7–25)
CO2: 27 mmol/L (ref 20–32)
Calcium: 8.4 mg/dL — ABNORMAL LOW (ref 8.6–10.3)
Chloride: 115 mmol/L — ABNORMAL HIGH (ref 98–110)
Creat: 2.04 mg/dL — ABNORMAL HIGH (ref 0.60–1.29)
Globulin: 2.4 g/dL (calc) (ref 1.9–3.7)
Glucose, Bld: 109 mg/dL — ABNORMAL HIGH (ref 65–99)
Potassium: 4.5 mmol/L (ref 3.5–5.3)
Sodium: 145 mmol/L (ref 135–146)
Total Bilirubin: 0.4 mg/dL (ref 0.2–1.2)
Total Protein: 5.8 g/dL — ABNORMAL LOW (ref 6.1–8.1)
eGFR: 41 mL/min/{1.73_m2} — ABNORMAL LOW (ref >=60)

## 2020-09-04 LAB — CBC WITH DIFFERENTIAL/PLATELET
Absolute Monocytes: 329 cells/uL (ref 200–950)
Basophils Absolute: 52 cells/uL (ref 0–200)
Basophils Relative: 1.1 %
Eosinophils Absolute: 127 cells/uL (ref 15–500)
Eosinophils Relative: 2.7 %
HCT: 35.4 % — ABNORMAL LOW (ref 38.5–50.0)
Hemoglobin: 11.4 g/dL — ABNORMAL LOW (ref 13.2–17.1)
Lymphs Abs: 1293 cells/uL (ref 850–3900)
MCH: 28.7 pg (ref 27.0–33.0)
MCHC: 32.2 g/dL (ref 32.0–36.0)
MCV: 89.2 fL (ref 80.0–100.0)
MPV: 10.3 fL (ref 7.5–12.5)
Monocytes Relative: 7 %
Neutro Abs: 2900 cells/uL (ref 1500–7800)
Neutrophils Relative %: 61.7 %
Platelets: 274 10*3/uL (ref 140–400)
RBC: 3.97 10*6/uL — ABNORMAL LOW (ref 4.20–5.80)
RDW: 12.9 % (ref 11.0–15.0)
Total Lymphocyte: 27.5 %
WBC: 4.7 10*3/uL (ref 3.8–10.8)

## 2020-09-04 LAB — LIPID PANEL
Cholesterol: 247 mg/dL — ABNORMAL HIGH (ref <200)
HDL: 66 mg/dL (ref >=40)
LDL Cholesterol (Calc): 163 mg/dL (calc) — ABNORMAL HIGH
Non-HDL Cholesterol (Calc): 181 mg/dL (calc) — ABNORMAL HIGH (ref <130)
Total CHOL/HDL Ratio: 3.7 (calc) (ref <5.0)
Triglycerides: 77 mg/dL (ref <150)

## 2020-12-04 ENCOUNTER — Ambulatory Visit (INDEPENDENT_AMBULATORY_CARE_PROVIDER_SITE_OTHER): Payer: Self-pay | Admitting: Medical-Surgical

## 2020-12-04 ENCOUNTER — Telehealth: Payer: Self-pay | Admitting: Medical-Surgical

## 2020-12-04 DIAGNOSIS — Z91199 Patient's noncompliance with other medical treatment and regimen due to unspecified reason: Secondary | ICD-10-CM

## 2020-12-04 NOTE — Telephone Encounter (Signed)
Thank you.  I will do it.

## 2020-12-04 NOTE — Telephone Encounter (Signed)
Pt called at 8:05 to let us know he will not be attending his appointment.  He worked third shift and he's tired.

## 2020-12-04 NOTE — Progress Notes (Signed)
   Complete physical exam  Patient: Hunter Villarreal   DOB: 10/26/1999   40 y.o. Male  MRN: [id number]  Subjective:    No chief complaint on file.   Hunter Villarreal is a 40 y.o. male who presents today for a complete physical exam. She reports consuming a {diet types:17450} diet. {types:19826} She generally feels {DESC; WELL/FAIRLY WELL/POORLY:18703}. She reports sleeping {DESC; WELL/FAIRLY WELL/POORLY:18703}. She {does/does not:200015} have additional problems to discuss today.    Most recent fall risk assessment:    07/03/2021   10:42 AM  Fall Risk   Falls in the past year? 0  Number falls in past yr: 0  Injury with Fall? 0  Risk for fall due to : No Fall Risks  Follow up Falls evaluation completed     Most recent depression screenings:    07/03/2021   10:42 AM 05/24/2020   10:46 AM  PHQ 2/9 Scores  PHQ - 2 Score 0 0  PHQ- 9 Score 5     {VISON DENTAL STD PSA (Optional):27386}  {History (Optional):23778}  Patient Care Team: Emori Mumme, NP as PCP - General (Nurse Practitioner)   Outpatient Medications Prior to Visit  Medication Sig   fluticasone (FLONASE) 50 MCG/ACT nasal spray Place 2 sprays into both nostrils in the morning and at bedtime. After 7 days, reduce to once daily.   norgestimate-ethinyl estradiol (SPRINTEC 28) 0.25-35 MG-MCG tablet Take 1 tablet by mouth daily.   Nystatin POWD Apply liberally to affected area 2 times per day   spironolactone (ALDACTONE) 100 MG tablet Take 1 tablet (100 mg total) by mouth daily.   No facility-administered medications prior to visit.    ROS        Objective:     There were no vitals taken for this visit. {Vitals History (Optional):23777}  Physical Exam   No results found for any visits on 08/08/21. {Show previous labs (optional):23779}    Assessment & Plan:    Routine Health Maintenance and Physical Exam  Immunization History  Administered Date(s) Administered   DTaP 01/09/1999, 03/07/1999,  05/16/1999, 01/30/2000, 08/15/2003   Hepatitis A 06/11/2007, 06/16/2008   Hepatitis B 10/27/1998, 12/04/1998, 05/16/1999   HiB (PRP-OMP) 01/09/1999, 03/07/1999, 05/16/1999, 01/30/2000   IPV 01/09/1999, 03/07/1999, 11/04/1999, 08/15/2003   Influenza,inj,Quad PF,6+ Mos 09/16/2013   Influenza-Unspecified 12/17/2011   MMR 11/03/2000, 08/15/2003   Meningococcal Polysaccharide 06/16/2011   Pneumococcal Conjugate-13 01/30/2000   Pneumococcal-Unspecified 05/16/1999, 07/30/1999   Tdap 06/16/2011   Varicella 11/04/1999, 06/11/2007    Health Maintenance  Topic Date Due   HIV Screening  Never done   Hepatitis C Screening  Never done   INFLUENZA VACCINE  08/06/2021   PAP-Cervical Cytology Screening  08/08/2021 (Originally 10/26/2019)   PAP SMEAR-Modifier  08/08/2021 (Originally 10/26/2019)   TETANUS/TDAP  08/08/2021 (Originally 06/15/2021)   HPV VACCINES  Discontinued   COVID-19 Vaccine  Discontinued    Discussed health benefits of physical activity, and encouraged her to engage in regular exercise appropriate for her age and condition.  Problem List Items Addressed This Visit   None Visit Diagnoses     Annual physical exam    -  Primary   Cervical cancer screening       Need for Tdap vaccination          No follow-ups on file.     Kathren Scearce, NP   

## 2021-01-16 ENCOUNTER — Emergency Department (INDEPENDENT_AMBULATORY_CARE_PROVIDER_SITE_OTHER)
Admission: EM | Admit: 2021-01-16 | Discharge: 2021-01-16 | Disposition: A | Discharge status: Home / Self Care | Payer: 59 | Source: Home / Self Care | Attending: Family Medicine | Admitting: Family Medicine

## 2021-01-16 ENCOUNTER — Other Ambulatory Visit: Payer: Self-pay

## 2021-01-16 ENCOUNTER — Encounter: Payer: Self-pay | Admitting: Emergency Medicine

## 2021-01-16 DIAGNOSIS — L0291 Cutaneous abscess, unspecified: Secondary | ICD-10-CM

## 2021-01-16 HISTORY — DX: Personal history of other infectious and parasitic diseases: Z86.19

## 2021-01-16 NOTE — ED Provider Notes (Signed)
[facility]    CSN: [id number] Arrival date & time: 01/16/21  1128      History   Chief Complaint Chief Complaint  Patient presents with   Abscess    HPI Hunter ELKHATIB is a 41 y.o. male.   HPI  Patient has swelling of the skin, infection, a nodule in his right groin and perineum area for several days.  He was seen at an outside urgent care center 4 days ago.  Started on clindamycin.  Also was given a Medrol pack.  States his last blood sugar was 120. He has had no fever or chills.  He said no body aches. He has not had a history of recurring skin infections or MRSA   Past Medical History:  Diagnosis Date   Diabetes mellitus without complication ([place])    History of shingles    Hypertension     Patient Active Problem List   Diagnosis Date Noted   Mixed hyperlipidemia 04/02/2020   Essential hypertension 05/26/2019   Diabetes ([place]) 12/07/2015    Past Surgical History:  Procedure Laterality Date   CATARACT EXTRACTION     REPAIR KNEE LIGAMENT         Home Medications    Prior to Admission medications   Medication Sig Start Date End Date Taking? Authorizing Provider  clindamycin (CLEOCIN) 300 MG capsule Take by mouth. 01/13/21 01/23/21 Yes [provider]  atorvastatin (LIPITOR) 20 MG tablet Take 1 tablet (20 mg total) by mouth daily. 09/03/20   Samuel Bouche, NP  glucose blood (ONETOUCH VERIO) test strip 1 each by Other route 2 (two) times daily. And lancets 2/day 12/21/18   Renato Shin, MD  insulin glargine (LANTUS SOLOSTAR) 100 UNIT/ML Solostar Pen Inject 20 Units into the skin daily. And pen needles 1/day 04/02/20   Samuel Bouche, NP  predniSONE (DELTASONE) 10 MG tablet Take by mouth. 01/14/21   [provider]  valsartan (DIOVAN) 80 MG tablet Take 1 tablet (80 mg total) by mouth daily. 04/02/20 09/03/20  Samuel Bouche, NP    Family History Family History  Problem Relation Age of Onset   Diabetes Mother    Diabetes Brother        type 2    Diabetes Maternal Grandfather    Diabetes Maternal Aunt    Brain cancer Cousin    Breast cancer Cousin     Social History Social History   Tobacco Use   Smoking status: Former    Packs/day: 0.50    Types: Cigarettes    Quit date: 02/09/2019    Years since quitting: 1.9    Passive exposure: Never   Smokeless tobacco: Never  Vaping Use   Vaping Use: Every day   Substances: Nicotine, Flavoring  Substance Use Topics   Alcohol use: Not Currently   Drug use: No     Allergies   Patient has no known allergies.   Review of Systems Review of Systems See HPI  Physical Exam Triage Vital Signs ED Triage Vitals  Enc Vitals Group     BP 01/16/21 1241 (!) 152/95     Pulse Rate 01/16/21 1241 81     Resp 01/16/21 1241 15     Temp 01/16/21 1241 98.3 F (36.8 C)     Temp Source 01/16/21 1241 Oral     SpO2 01/16/21 1241 99 %     Weight 01/16/21 1244 165 lb (74.8 kg)     Height 01/16/21 1244 5\' 11"  (1.803 m)  Head Circumference --      Peak Flow --      Pain Score 01/16/21 1243 5     Pain Loc --      Pain Edu? --      Excl. in [place]? --    No data found.  Updated Vital Signs BP (!) 152/95 (BP Location: Left Arm)    Pulse 81    Temp 98.3 F (36.8 C) (Oral)    Resp 15    Ht 5\' 11"  (1.803 m)    Wt 74.8 kg    SpO2 99%    BMI 23.01 kg/m      Physical Exam Constitutional:      General: He is not in acute distress.    Appearance: Normal appearance. He is well-developed and normal weight.  HENT:     Head: Normocephalic and atraumatic.     Mouth/Throat:     Comments: Mask is in place Eyes:     Conjunctiva/sclera: Conjunctivae normal.     Pupils: Pupils are equal, round, and reactive to light.  Cardiovascular:     Rate and Rhythm: Normal rate.  Pulmonary:     Effort: Pulmonary effort is normal. No respiratory distress.  Abdominal:     General: There is no distension.     Palpations: Abdomen is soft.  Genitourinary:   Musculoskeletal:        General: Normal range  of motion.     Cervical back: Normal range of motion.  Skin:    General: Skin is warm and dry.     Findings: Lesion present.  Neurological:     Mental Status: He is alert.  Psychiatric:        Mood and Affect: Mood normal.        Behavior: Behavior normal.     UC Treatments / Results  Labs (all labs ordered are listed, but only abnormal results are displayed) Labs Reviewed - No data to display  EKG   Radiology No results found.  Procedures Incision and Drainage  Date/Time: 01/16/2021 2:42 PM Performed by: Raylene Everts, MD Authorized by: Raylene Everts, MD   Consent:    Consent obtained:  Verbal   Consent given by:  Patient   Risks discussed:  Bleeding and incomplete drainage   Alternatives discussed:  No treatment and delayed treatment Universal protocol:    Procedure explained and questions answered to patient or proxy's satisfaction: yes     Patient identity confirmed:  Verbally with patient and arm band Location:    Type:  Abscess   Location:  Anogenital   Anogenital location:  Perineum Pre-procedure details:    Skin preparation:  Antiseptic wash Anesthesia:    Anesthesia method:  Local infiltration   Local anesthetic:  Lidocaine 1% w/o epi Procedure type:    Complexity:  Simple Procedure details:    Ultrasound guidance: no     Needle aspiration: no     Incision types:  Stab incision   Incision depth:  Subcutaneous   Wound management:  Probed and deloculated   Drainage:  Bloody and purulent   Drainage amount:  Scant   Wound treatment:  Wound left open   Packing materials:  1/4 in iodoform gauze Post-procedure details:    Procedure completion:  Tolerated (including critical care time)  Medications Ordered in UC Medications - No data to display  Initial Impression / Assessment and Plan / UC Course  I have reviewed the triage vital signs and the nursing notes.  Pertinent labs & imaging results that were available during my care of the  patient were reviewed by me and considered in my medical decision making (see chart for details).     Gauze wick is placed to keep opening patent and allow drainage.  The abscess drained moderate purulence with pieces of sebum intermixed  Final Clinical Impressions(s) / UC Diagnoses   Final diagnoses:  Abscess     Discharge Instructions      Continue taking antibiotic until it is finished Take ibuprofen 600 mg 3 times a day with food, if needed for pain May continue warm compresses a couple times a day Protect the gauze in the opening at least until tomorrow, it will allow continued drainage The bleeding and drainage should stop over the next day or 2 See your primary care doctor in follow-up   ED Prescriptions   None    PDMP not reviewed this encounter.   Raylene Everts, MD 01/16/21 [phone]

## 2021-01-16 NOTE — Discharge Instructions (Signed)
Continue taking antibiotic until it is finished Take ibuprofen 600 mg 3 times a day with food, if needed for pain May continue warm compresses a couple times a day Protect the gauze in the opening at least until tomorrow, it will allow continued drainage The bleeding and drainage should stop over the next day or 2 See your primary care doctor in follow-up

## 2021-01-16 NOTE — ED Triage Notes (Signed)
Absess to right groin  Was at [facility] on Sunday - started on Clindamycin & prednisone taper on Monday am  No drainage per pt  Feels like it has gotten bigger since then & more painful  Denies fevers Less pain today  Moving increases pain

## 2021-03-15 ENCOUNTER — Encounter: Payer: Self-pay | Admitting: Medical-Surgical

## 2021-03-15 ENCOUNTER — Ambulatory Visit: Payer: 59 | Admitting: Medical-Surgical

## 2021-03-15 ENCOUNTER — Other Ambulatory Visit: Payer: Self-pay

## 2021-03-15 VITALS — BP 168/108 | HR 72 | Ht 71.0 in | Wt 170.0 lb

## 2021-03-15 DIAGNOSIS — I10 Essential (primary) hypertension: Secondary | ICD-10-CM

## 2021-03-15 DIAGNOSIS — Z794 Long term (current) use of insulin: Secondary | ICD-10-CM | POA: Diagnosis not present

## 2021-03-15 DIAGNOSIS — E782 Mixed hyperlipidemia: Secondary | ICD-10-CM | POA: Diagnosis not present

## 2021-03-15 DIAGNOSIS — R197 Diarrhea, unspecified: Secondary | ICD-10-CM

## 2021-03-15 DIAGNOSIS — R4586 Emotional lability: Secondary | ICD-10-CM

## 2021-03-15 DIAGNOSIS — E1159 Type 2 diabetes mellitus with other circulatory complications: Secondary | ICD-10-CM | POA: Diagnosis not present

## 2021-03-15 LAB — POCT GLYCOSYLATED HEMOGLOBIN (HGB A1C): Hemoglobin A1C: 8.8 % — AB (ref 4.0–5.6)

## 2021-03-15 MED ORDER — VALSARTAN 160 MG PO TABS: 160.0000 mg | ORAL_TABLET | Freq: Every day | ORAL | 3 refills | Status: DC

## 2021-03-15 NOTE — Progress Notes (Signed)
?  HPI with pertinent ROS:  ? ?CC: DM/HTN/HLD follow up ? ?HPI: ?Pleasant 41 year old male presenting today for the following: ? ?DM- checking sugars about 3 times a week with average readings of 150. Not taking his Lantus every night. He will take the full 20 units of Lantus on a "down" day when he doesn't have to work. If he has to work, he will take a lesser amount or none at all because he gets shaky and doesn't feel well. Endorses some intermittent foot paresthesias but they are mild and infrequent.  ? ?HTN- not taking BP medication. Reports he needs to get better about taking pills every day. Not checking BP at home. Not following a low sodium diet. Eats [employer] to prompt himself to go home and exercise after work. ? ?HLD- not currently taking a statin medication although this has been approached in the past.  ? ?Diarrhea- has periods of diarrhea lasting a couple of days off and on. Sometimes goes a couple of months between episodes and other times not that long. No associated factors identified. No other symptoms.  ? ?Mood- has been having some stress surrounding his son lately. Would like to see a Social worker.  ? ?I reviewed the past medical history, family history, social history, surgical history, and allergies today and no changes were needed.  Please see the problem list section below in epic for further details. ? ? ?Physical exam:  ? ?General: Well Developed, well nourished, and in no acute distress.  ?Neuro: Alert and oriented x3.  ?HEENT: Normocephalic, atraumatic.  ?Skin: Warm and dry. ?Cardiac: Regular rate and rhythm, no murmurs rubs or gallops, no lower extremity edema.  ?Respiratory: Clear to auscultation bilaterally. Not using accessory muscles, speaking in full sentences. ? ?Impression and Recommendations:   ? ?1. Essential hypertension ?Checking labs. Restart Valsartan at 160mg  daily. Recommend monitoring BP at home with goal of 130/80 or less. Low sodium diet.  ?- CBC with  Differential/Platelet ?- COMPLETE METABOLIC PANEL WITH GFR ? ?2. Type 2 diabetes mellitus with other circulatory complication, with long-term current use of insulin ([place]) ?POCT Hgba1C 8.8% up from 8.3% at last check. Checking CMP. Referring to Endocrinology. From previous notes it looks like there was suspicion for Type 1.5 diabetes. With noncompliance with diet and medication instructions, control best addressed by Endocrinology. Encouraged patient to make and keep an appointment for optimal management and health.  ?- COMPLETE METABOLIC PANEL WITH GFR ?- POCT glycosylated hemoglobin (Hb A1C) ?- Ambulatory referral to Endocrinology ? ?3. Mixed hyperlipidemia ?Checking lipid panel.  ?- Lipid panel ? ?4. Diarrhea, unspecified type ?Unclear etiology. No current symptoms. Unremarkable exam. Advised to monitor symptoms and keep a diary of all food and situations for the previous 24-48 hours to help pinpoint a cause. Suspect that anxiety may be playing a role.  ? ?5. Mood changes ?Referring to behavioral health for counseling per patient request.  ?- Ambulatory referral to [facility] ? ?Return in about 2 weeks (around 03/29/2021) for nurse visit for BP check. ?___________________________________________ ?Clearnce Sorrel, DNP, APRN, FNP-BC ?Primary Care and Sports Medicine ?[place] ?

## 2021-03-16 LAB — LIPID PANEL
Cholesterol: 225 mg/dL — ABNORMAL HIGH (ref <200)
HDL: 73 mg/dL (ref >=40)
LDL Cholesterol (Calc): 135 mg/dL (calc) — ABNORMAL HIGH
Non-HDL Cholesterol (Calc): 152 mg/dL (calc) — ABNORMAL HIGH (ref <130)
Total CHOL/HDL Ratio: 3.1 (calc) (ref <5.0)
Triglycerides: 77 mg/dL (ref <150)

## 2021-03-16 LAB — CBC WITH DIFFERENTIAL/PLATELET
Absolute Monocytes: 308 cells/uL (ref 200–950)
Basophils Absolute: 49 cells/uL (ref 0–200)
Basophils Relative: 1.2 %
Eosinophils Absolute: 189 cells/uL (ref 15–500)
Eosinophils Relative: 4.6 %
HCT: 31.2 % — ABNORMAL LOW (ref 38.5–50.0)
Hemoglobin: 10.1 g/dL — ABNORMAL LOW (ref 13.2–17.1)
Lymphs Abs: 1328 cells/uL (ref 850–3900)
MCH: 28.5 pg (ref 27.0–33.0)
MCHC: 32.4 g/dL (ref 32.0–36.0)
MCV: 88.1 fL (ref 80.0–100.0)
MPV: 10.1 fL (ref 7.5–12.5)
Monocytes Relative: 7.5 %
Neutro Abs: 2226 cells/uL (ref 1500–7800)
Neutrophils Relative %: 54.3 %
Platelets: 271 10*3/uL (ref 140–400)
RBC: 3.54 10*6/uL — ABNORMAL LOW (ref 4.20–5.80)
RDW: 12 % (ref 11.0–15.0)
Total Lymphocyte: 32.4 %
WBC: 4.1 10*3/uL (ref 3.8–10.8)

## 2021-03-16 LAB — COMPLETE METABOLIC PANEL WITH GFR
AG Ratio: 1.5 (calc) (ref 1.0–2.5)
ALT: 22 U/L (ref 9–46)
AST: 20 U/L (ref 10–40)
Albumin: 3.4 g/dL — ABNORMAL LOW (ref 3.6–5.1)
Alkaline phosphatase (APISO): 80 U/L (ref 36–130)
BUN/Creatinine Ratio: 14 (calc) (ref 6–22)
BUN: 37 mg/dL — ABNORMAL HIGH (ref 7–25)
CO2: 27 mmol/L (ref 20–32)
Calcium: 8.2 mg/dL — ABNORMAL LOW (ref 8.6–10.3)
Chloride: 108 mmol/L (ref 98–110)
Creat: 2.72 mg/dL — ABNORMAL HIGH (ref 0.60–1.29)
Globulin: 2.3 g/dL (calc) (ref 1.9–3.7)
Glucose, Bld: 263 mg/dL — ABNORMAL HIGH (ref 65–99)
Potassium: 4.8 mmol/L (ref 3.5–5.3)
Sodium: 140 mmol/L (ref 135–146)
Total Bilirubin: 0.4 mg/dL (ref 0.2–1.2)
Total Protein: 5.7 g/dL — ABNORMAL LOW (ref 6.1–8.1)
eGFR: 29 mL/min/{1.73_m2} — ABNORMAL LOW (ref >=60)

## 2021-03-29 ENCOUNTER — Ambulatory Visit: Payer: 59

## 2021-05-28 ENCOUNTER — Ambulatory Visit: Payer: 59 | Admitting: Medical-Surgical

## 2021-06-21 ENCOUNTER — Encounter: Payer: Self-pay | Admitting: Medical-Surgical

## 2021-06-21 ENCOUNTER — Ambulatory Visit: Payer: 59 | Admitting: Medical-Surgical

## 2021-07-11 ENCOUNTER — Encounter: Payer: Self-pay | Admitting: Emergency Medicine

## 2021-07-11 ENCOUNTER — Emergency Department (INDEPENDENT_AMBULATORY_CARE_PROVIDER_SITE_OTHER)
Admission: EM | Admit: 2021-07-11 | Discharge: 2021-07-11 | Disposition: A | Discharge status: Home / Self Care | Payer: 59 | Source: Home / Self Care

## 2021-07-11 DIAGNOSIS — R197 Diarrhea, unspecified: Secondary | ICD-10-CM

## 2021-07-11 DIAGNOSIS — R112 Nausea with vomiting, unspecified: Secondary | ICD-10-CM | POA: Diagnosis not present

## 2021-07-11 MED ORDER — ONDANSETRON 8 MG PO TBDP: 8.0000 mg | ORAL_TABLET | Freq: Three times a day (TID) | ORAL | 0 refills | Status: DC | PRN

## 2021-07-11 NOTE — Discharge Instructions (Addendum)
Advised patient may take Zofran daily, as needed for nausea.  Encouraged patient to increase daily water intake to 50 to 64 ounces per day.  Encouraged patient to follow-up with PCP regarding current symptoms.  Walked patient over to family practice side to schedule appointment with PCP.  Advised patient if symptoms continue and/or unresolved please follow-up with PCP or go to nearest ED for immediate evaluation.

## 2021-07-11 NOTE — ED Provider Notes (Signed)
[facility]    CSN: [id number] Arrival date & time: 07/11/21  1305      History   Chief Complaint Chief Complaint  Patient presents with   Diarrhea    HPI Hunter Villarreal is a 41 y.o. male.   HPI 41 year old male presents with vomiting and diarrhea on and off for 2 months.  Patient reports is diabetic and has talked to take PCP regarding daily nutritional habits and feels this is not related to his daily diet.  Reports taking Tums, Pepto-Bismol which make patient vomit.  PMH significant for T2DM (uncontrolled HA1C of 03/15/2021 was 8.8), HTN, and mixed HLD.  Additionally CMP specifically creatinine from 03/15/2021 was 2.72 with a GFR of 29.  Advised patient this reveals CKD stage IV and requires nephrology consult.  Chart review revealed no-show PCP appointments on 03/29/2021 and 06/21/2021.  Past Medical History:  Diagnosis Date   Diabetes mellitus without complication ([place])    History of shingles    Hypertension     Patient Active Problem List   Diagnosis Date Noted   Mixed hyperlipidemia 04/02/2020   Essential hypertension 05/26/2019   Diabetes ([place]) 12/07/2015    Past Surgical History:  Procedure Laterality Date   CATARACT EXTRACTION     REPAIR KNEE LIGAMENT         Home Medications    Prior to Admission medications   Medication Sig Start Date End Date Taking? Authorizing Provider  atorvastatin (LIPITOR) 20 MG tablet Take 1 tablet (20 mg total) by mouth daily. 09/03/20  Yes Samuel Bouche, NP  glucose blood (ONETOUCH VERIO) test strip 1 each by Other route 2 (two) times daily. And lancets 2/day 12/21/18  Yes Renato Shin, MD  insulin glargine (LANTUS SOLOSTAR) 100 UNIT/ML Solostar Pen Inject 20 Units into the skin daily. And pen needles 1/day 04/02/20  Yes Samuel Bouche, NP  ondansetron (ZOFRAN-ODT) 8 MG disintegrating tablet Take 1 tablet (8 mg total) by mouth every 8 (eight) hours as needed for nausea or vomiting. 07/11/21  Yes Eliezer Lofts, FNP  valsartan  (DIOVAN) 160 MG tablet Take 1 tablet (160 mg total) by mouth daily. 03/15/21  Yes Samuel Bouche, NP    Family History Family History  Problem Relation Age of Onset   Diabetes Mother    Diabetes Brother        type 2   Diabetes Maternal Grandfather    Diabetes Maternal Aunt    Brain cancer Cousin    Breast cancer Cousin     Social History Social History   Tobacco Use   Smoking status: Former    Packs/day: 0.50    Types: Cigarettes    Quit date: 02/09/2019    Years since quitting: 2.4    Passive exposure: Never   Smokeless tobacco: Never  Vaping Use   Vaping Use: Every day   Substances: Nicotine, Flavoring  Substance Use Topics   Alcohol use: Not Currently   Drug use: No     Allergies   Patient has no known allergies.   Review of Systems Review of Systems  Gastrointestinal:  Positive for diarrhea, nausea and vomiting.  All other systems reviewed and are negative.    Physical Exam Triage Vital Signs ED Triage Vitals  Enc Vitals Group     BP 07/11/21 1328 (!) 155/98     Pulse Rate 07/11/21 1328 91     Resp 07/11/21 1328 18     Temp 07/11/21 1328 98.7 F (37.1 C)  Temp Source 07/11/21 1328 Oral     SpO2 07/11/21 1328 98 %     Weight 07/11/21 1330 160 lb (72.6 kg)     Height 07/11/21 1330 5\' 11"  (1.803 m)     Head Circumference --      Peak Flow --      Pain Score 07/11/21 1329 0     Pain Loc --      Pain Edu? --      Excl. in [place]? --    No data found.  Updated Vital Signs BP (!) 155/98 (BP Location: Right Arm)   Pulse 91   Temp 98.7 F (37.1 C) (Oral)   Resp 18   Ht 5\' 11"  (1.803 m)   Wt 160 lb (72.6 kg)   SpO2 98%   BMI 22.32 kg/m      Physical Exam Vitals and nursing note reviewed.  Constitutional:      General: He is not in acute distress.    Appearance: Normal appearance. He is normal weight.  HENT:     Head: Normocephalic and atraumatic.     Mouth/Throat:     Mouth: Mucous membranes are moist.     Pharynx: Oropharynx is clear.   Eyes:     Extraocular Movements: Extraocular movements intact.     Conjunctiva/sclera: Conjunctivae normal.     Pupils: Pupils are equal, round, and reactive to light.  Cardiovascular:     Rate and Rhythm: Normal rate and regular rhythm.     Pulses: Normal pulses.     Heart sounds: No murmur heard. Pulmonary:     Effort: Pulmonary effort is normal.     Breath sounds: No wheezing, rhonchi or rales.  Abdominal:     General: Bowel sounds are normal. There is no distension.     Palpations: Abdomen is soft. There is no mass.     Tenderness: There is no abdominal tenderness. There is no right CVA tenderness, left CVA tenderness, guarding or rebound.     Hernia: No hernia is present.     Comments: No hepatosplenomegaly  Musculoskeletal:     Cervical back: Normal range of motion and neck supple.  Skin:    General: Skin is warm and dry.  Neurological:     General: No focal deficit present.     Mental Status: He is alert and oriented to person, place, and time.      UC Treatments / Results  Labs (all labs ordered are listed, but only abnormal results are displayed) Labs Reviewed - No data to display  EKG   Radiology No results found.  Procedures Procedures (including critical care time)  Medications Ordered in UC Medications - No data to display  Initial Impression / Assessment and Plan / UC Course  I have reviewed the triage vital signs and the nursing notes.  Pertinent labs & imaging results that were available during my care of the patient were reviewed by me and considered in my medical decision making (see chart for details).     MDM: 1.  Nausea vomiting and diarrhea-Rx'd Zofran. Advised patient may take Zofran daily, as needed for nausea.  Encouraged patient to increase daily water intake to 50 to 64 ounces per day.  Encouraged patient to follow-up with PCP regarding current symptoms.  Walked patient over to family practice side to schedule appointment with PCP.   Advised patient if symptoms continue and/or unresolved please follow-up with PCP or go to nearest ED for immediate evaluation.  Patient  discharged home, hemodynamically stable. Final Clinical Impressions(s) / UC Diagnoses   Final diagnoses:  Nausea vomiting and diarrhea     Discharge Instructions      Advised patient may take Zofran daily, as needed for nausea.  Encouraged patient to increase daily water intake to 50 to 64 ounces per day.  Encouraged patient to follow-up with PCP regarding current symptoms.  Walked patient over to family practice side to schedule appointment with PCP.  Advised patient if symptoms continue and/or unresolved please follow-up with PCP or go to nearest ED for immediate evaluation.     ED Prescriptions     Medication Sig Dispense Auth. Provider   ondansetron (ZOFRAN-ODT) 8 MG disintegrating tablet Take 1 tablet (8 mg total) by mouth every 8 (eight) hours as needed for nausea or vomiting. 24 tablet Eliezer Lofts, FNP      PDMP not reviewed this encounter.   Eliezer Lofts, [place] 07/11/21 1821

## 2021-07-11 NOTE — ED Triage Notes (Signed)
Patient c/o vomiting and diarrhea x 2 months, off and on.  Patient is diabetic, has talked to PCP advised to watch his diet.  Patient doesn't feel it's his diet.  Patient has taken TUMS, pepto bismol makes patient vomit.

## 2021-08-02 ENCOUNTER — Ambulatory Visit: Payer: 59 | Admitting: Medical-Surgical

## 2021-08-02 ENCOUNTER — Encounter: Payer: Self-pay | Admitting: Medical-Surgical

## 2021-08-02 VITALS — BP 125/82 | HR 81 | Resp 20 | Ht 71.0 in | Wt 170.4 lb

## 2021-08-02 DIAGNOSIS — E1159 Type 2 diabetes mellitus with other circulatory complications: Secondary | ICD-10-CM

## 2021-08-02 DIAGNOSIS — Z794 Long term (current) use of insulin: Secondary | ICD-10-CM

## 2021-08-02 DIAGNOSIS — R197 Diarrhea, unspecified: Secondary | ICD-10-CM | POA: Insufficient documentation

## 2021-08-02 DIAGNOSIS — I10 Essential (primary) hypertension: Secondary | ICD-10-CM

## 2021-08-02 DIAGNOSIS — E782 Mixed hyperlipidemia: Secondary | ICD-10-CM

## 2021-08-02 DIAGNOSIS — Z638 Other specified problems related to primary support group: Secondary | ICD-10-CM | POA: Insufficient documentation

## 2021-08-02 LAB — POCT UA - MICROALBUMIN
Creatinine, POC: 300 mg/dL
Microalbumin Ur, POC: 150 mg/L

## 2021-08-02 LAB — POCT GLYCOSYLATED HEMOGLOBIN (HGB A1C): HbA1c, POC (controlled diabetic range): 10.6 % — AB (ref 0.0–7.0)

## 2021-08-02 MED ORDER — ONDANSETRON 8 MG PO TBDP: 8.0000 mg | ORAL_TABLET | Freq: Three times a day (TID) | ORAL | 0 refills | Status: DC | PRN

## 2021-08-02 MED ORDER — BLOOD GLUCOSE MONITOR KIT: PACK | 0 refills | Status: DC

## 2021-08-02 MED ORDER — ATORVASTATIN CALCIUM 20 MG PO TABS: 20.0000 mg | ORAL_TABLET | Freq: Every day | ORAL | 1 refills | Status: DC

## 2021-08-02 MED ORDER — VALSARTAN 160 MG PO TABS: 160.0000 mg | ORAL_TABLET | Freq: Every day | ORAL | 3 refills | Status: DC
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
## 2021-08-03 LAB — LIPID PANEL
Cholesterol: 226 mg/dL — ABNORMAL HIGH (ref <200)
HDL: 70 mg/dL (ref >=40)
LDL Cholesterol (Calc): 126 mg/dL (calc) — ABNORMAL HIGH
Non-HDL Cholesterol (Calc): 156 mg/dL (calc) — ABNORMAL HIGH (ref <130)
Total CHOL/HDL Ratio: 3.2 (calc) (ref <5.0)
Triglycerides: 179 mg/dL — ABNORMAL HIGH (ref <150)

## 2021-08-03 LAB — COMPLETE METABOLIC PANEL WITH GFR
AG Ratio: 1.6 (calc) (ref 1.0–2.5)
ALT: 22 U/L (ref 9–46)
AST: 23 U/L (ref 10–40)
Albumin: 3.3 g/dL — ABNORMAL LOW (ref 3.6–5.1)
Alkaline phosphatase (APISO): 100 U/L (ref 36–130)
BUN/Creatinine Ratio: 10 (calc) (ref 6–22)
BUN: 36 mg/dL — ABNORMAL HIGH (ref 7–25)
CO2: 24 mmol/L (ref 20–32)
Calcium: 8.1 mg/dL — ABNORMAL LOW (ref 8.6–10.3)
Chloride: 109 mmol/L (ref 98–110)
Creat: 3.72 mg/dL — ABNORMAL HIGH (ref 0.60–1.29)
Globulin: 2.1 g/dL (calc) (ref 1.9–3.7)
Glucose, Bld: 261 mg/dL — ABNORMAL HIGH (ref 65–99)
Potassium: 5.7 mmol/L — ABNORMAL HIGH (ref 3.5–5.3)
Sodium: 140 mmol/L (ref 135–146)
Total Bilirubin: 0.3 mg/dL (ref 0.2–1.2)
Total Protein: 5.4 g/dL — ABNORMAL LOW (ref 6.1–8.1)
eGFR: 20 mL/min/{1.73_m2} — ABNORMAL LOW (ref >=60)

## 2021-08-03 LAB — CBC WITH DIFFERENTIAL/PLATELET
Absolute Monocytes: 350 cells/uL (ref 200–950)
Basophils Absolute: 41 cells/uL (ref 0–200)
Basophils Relative: 0.9 %
Eosinophils Absolute: 189 cells/uL (ref 15–500)
Eosinophils Relative: 4.1 %
HCT: 30 % — ABNORMAL LOW (ref 38.5–50.0)
Hemoglobin: 9.8 g/dL — ABNORMAL LOW (ref 13.2–17.1)
Lymphs Abs: 1458 cells/uL (ref 850–3900)
MCH: 28.9 pg (ref 27.0–33.0)
MCHC: 32.7 g/dL (ref 32.0–36.0)
MCV: 88.5 fL (ref 80.0–100.0)
MPV: 9.9 fL (ref 7.5–12.5)
Monocytes Relative: 7.6 %
Neutro Abs: 2562 cells/uL (ref 1500–7800)
Neutrophils Relative %: 55.7 %
Platelets: 291 10*3/uL (ref 140–400)
RBC: 3.39 10*6/uL — ABNORMAL LOW (ref 4.20–5.80)
RDW: 12.6 % (ref 11.0–15.0)
Total Lymphocyte: 31.7 %
WBC: 4.6 10*3/uL (ref 3.8–10.8)

## 2021-08-04 ENCOUNTER — Other Ambulatory Visit: Payer: Self-pay | Admitting: Medical-Surgical

## 2021-08-04 DIAGNOSIS — E1122 Type 2 diabetes mellitus with diabetic chronic kidney disease: Secondary | ICD-10-CM

## 2021-08-04 DIAGNOSIS — E782 Mixed hyperlipidemia: Secondary | ICD-10-CM

## 2021-08-04 MED ORDER — ATORVASTATIN CALCIUM 40 MG PO TABS: 40.0000 mg | ORAL_TABLET | Freq: Every day | ORAL | 1 refills | Status: DC

## 2021-08-07 ENCOUNTER — Other Ambulatory Visit: Payer: Self-pay | Admitting: Medical-Surgical

## 2021-08-09 LAB — SALMONELLA/SHIGELLA CULT, CAMPY EIA AND SHIGA TOXIN RFL ECOLI
MICRO NUMBER: 13716714
MICRO NUMBER:: 13716715
MICRO NUMBER:: 13716716
Result:: NOT DETECTED
SHIGA RESULT:: NOT DETECTED
SPECIMEN QUALITY: ADEQUATE
SPECIMEN QUALITY:: ADEQUATE
SPECIMEN QUALITY:: ADEQUATE

## 2021-08-09 LAB — C. DIFFICILE GDH AND TOXIN A/B
GDH ANTIGEN: NOT DETECTED
MICRO NUMBER:: 13719175
SPECIMEN QUALITY:: ADEQUATE
TOXIN A AND B: NOT DETECTED

## 2021-08-09 LAB — FECAL LACTOFERRIN, QUANT
Fecal Lactoferrin: NEGATIVE
MICRO NUMBER:: 13719230
SPECIMEN QUALITY:: ADEQUATE

## 2021-08-09 LAB — OVA AND PARASITE EXAMINATION
CONCENTRATE RESULT:: NONE SEEN
MICRO NUMBER:: 13716655
SPECIMEN QUALITY:: ADEQUATE
TRICHROME RESULT:: NONE SEEN

## 2021-08-23 NOTE — Addendum Note (Signed)
Addended bySamuel Bouche on: 08/23/2021 07:17 AM   Modules accepted: Orders

## 2021-11-03 NOTE — Progress Notes (Unsigned)
   Established Patient Office Visit  Subjective   Patient ID: Hunter Villarreal, male   DOB: [birth year] Age: 41 y.o. MRN: [id number]   No chief complaint on file.   HPI Pleasant 41 year old male presenting today for follow up on:  DM:  HTN:  HLD:  Objective:    There were no vitals filed for this visit.  Physical Exam   No results found for this or any previous visit (from the past 24 hour(s)).   {Labs (Optional):23779}  The 10-year ASCVD risk score (Arnett DK, et al., 2019) is: 22.1%   Values used to calculate the score:     Age: 65 years     Sex: Male     Is Non-Hispanic African American: Yes     Diabetic: Yes     Tobacco smoker: Yes     Systolic Blood Pressure: 150 mmHg     Is BP treated: Yes     HDL Cholesterol: 70 mg/dL     Total Cholesterol: 226 mg/dL   Assessment & Plan:   No problem-specific Assessment & Plan notes found for this encounter.   No follow-ups on file.  ___________________________________________ Thayer Ohm, DNP, APRN, FNP-BC Primary Care and Sports Medicine [facility] [place]

## 2021-11-04 ENCOUNTER — Ambulatory Visit (INDEPENDENT_AMBULATORY_CARE_PROVIDER_SITE_OTHER): Payer: 59 | Admitting: Medical-Surgical

## 2021-11-04 DIAGNOSIS — E1159 Type 2 diabetes mellitus with other circulatory complications: Secondary | ICD-10-CM

## 2021-11-04 DIAGNOSIS — Z91199 Patient's noncompliance with other medical treatment and regimen due to unspecified reason: Secondary | ICD-10-CM

## 2021-11-04 DIAGNOSIS — E782 Mixed hyperlipidemia: Secondary | ICD-10-CM

## 2021-11-04 DIAGNOSIS — I10 Essential (primary) hypertension: Secondary | ICD-10-CM

## 2021-12-06 ENCOUNTER — Other Ambulatory Visit: Payer: Self-pay

## 2021-12-06 DIAGNOSIS — E1159 Type 2 diabetes mellitus with other circulatory complications: Secondary | ICD-10-CM

## 2021-12-23 ENCOUNTER — Encounter: Payer: Self-pay | Admitting: Medical-Surgical

## 2021-12-23 ENCOUNTER — Ambulatory Visit (INDEPENDENT_AMBULATORY_CARE_PROVIDER_SITE_OTHER): Payer: 59 | Admitting: Medical-Surgical

## 2021-12-23 VITALS — BP 129/88 | HR 100 | Resp 20 | Ht 71.0 in | Wt 161.0 lb

## 2021-12-23 DIAGNOSIS — Z794 Long term (current) use of insulin: Secondary | ICD-10-CM

## 2021-12-23 DIAGNOSIS — I10 Essential (primary) hypertension: Secondary | ICD-10-CM

## 2021-12-23 DIAGNOSIS — E1159 Type 2 diabetes mellitus with other circulatory complications: Secondary | ICD-10-CM | POA: Diagnosis not present

## 2021-12-23 DIAGNOSIS — Z23 Encounter for immunization: Secondary | ICD-10-CM

## 2021-12-23 DIAGNOSIS — E782 Mixed hyperlipidemia: Secondary | ICD-10-CM

## 2021-12-23 DIAGNOSIS — N184 Chronic kidney disease, stage 4 (severe): Secondary | ICD-10-CM | POA: Insufficient documentation

## 2021-12-23 DIAGNOSIS — E1121 Type 2 diabetes mellitus with diabetic nephropathy: Secondary | ICD-10-CM

## 2021-12-23 MED ORDER — TIRZEPATIDE 2.5 MG/0.5ML ~~LOC~~ SOAJ: 2.5000 mg | SUBCUTANEOUS | 0 refills | Status: DC

## 2021-12-23 NOTE — Progress Notes (Signed)
Established Patient Office Visit  Subjective   Patient ID: Hunter Villarreal, male   DOB: [birth year] Age: 41 y.o. MRN: [id number]   Chief Complaint  Patient presents with   Follow-up   Hypertension   Diabetes    HPI Pleasant 41 year old male presenting today for the following:  Hypertension: Taking amlodipine 10 mg daily and hydrochlorothiazide 12.5 mg daily, tolerating well without side effects.  Previously taking valsartan however has been taken off this by nephrology.  Not regularly monitoring blood pressures at home.  Tries to follow a low-sodium diet but admits that he has a lot of junk in his diet when he is at work.  Previously exercising regularly however has not been doing this over the last couple of months. Denies CP, SOB, palpitations, lower extremity edema, dizziness, headaches, or vision changes.  Diabetes: Most recent A1c 13.0%.  He reports that he does check his sugars and although he has had a few that are over 200, most of his readings are around 150 fasting.  Has been prescribed Lantus 20 units nightly however he uses this as a titratable medication.  If he is working in a more sedentary role for his shift, he will take the Lantus 20 units however if he is more active on the job, he will take a reduced amount.  At times he even takes no Lantus.  Tried Rybelsus but did not tolerate this at all.  Is open to other options.  When discussing his diet, he notes that he will sometimes eat an apple before work but when he is at work and hungry, he tends to aim towards junk food that is available in the vending machines.  Notes that he eats chips and more starchy foods regularly.   Objective:    Vitals:   12/23/21 0922  BP: 129/88  Pulse: 100  Resp: 20  Height: 5\' 11"  (1.803 m)  Weight: 161 lb (73 kg)  SpO2: 99%  BMI (Calculated): 22.46    Physical Exam Vitals reviewed.  Constitutional:      General: He is not in acute distress.    Appearance: Normal appearance. He is  normal weight. He is not ill-appearing.  HENT:     Head: Normocephalic and atraumatic.  Cardiovascular:     Rate and Rhythm: Normal rate and regular rhythm.     Pulses: Normal pulses.     Heart sounds: Normal heart sounds. No murmur heard.    No friction rub. No gallop.  Pulmonary:     Effort: Pulmonary effort is normal. No respiratory distress.     Breath sounds: Normal breath sounds.  Skin:    General: Skin is warm and dry.  Neurological:     Mental Status: He is alert and oriented to person, place, and time.  Psychiatric:        Mood and Affect: Mood normal.        Behavior: Behavior normal.        Thought Content: Thought content normal.        Judgment: Judgment normal.   No results found for this or any previous visit (from the past 24 hour(s)).     The 10-year ASCVD risk score (Arnett DK, et al., 2019) is: 17.1%   Values used to calculate the score:     Age: 66 years     Sex: Male     Is Non-Hispanic African American: Yes     Diabetic: Yes     Tobacco smoker: Yes  Systolic Blood Pressure: 016 mmHg     Is BP treated: Yes     HDL Cholesterol: 70 mg/dL     Total Cholesterol: 226 mg/dL   Assessment & Plan:   1. Essential hypertension Blood pressure at goal today.  Continue amlodipine and hydrochlorothiazide as prescribed.  2. Type 2 diabetes mellitus with other circulatory complication, with long-term current use of insulin ([place]) Candid discussion held regarding diabetes management.  With his recent diagnosis of stage IV chronic kidney disease and nodular diabetic glomerulosclerosis, it is extremely important to get the diabetes under control.  Continue Lantus 20 units nightly.  Recommend against titrating this medication.  Starting Mounjaro 2.5 mg weekly.  We will have to be careful with his weight as we cannot afford any excess weight loss at this point.  If this is not tolerated, consider starting mealtime insulin.  Discussed recommendations for regular intentional  exercise and diabetic diet modifications.  3. Mixed hyperlipidemia Continue atorvastatin 40 mg daily.  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 CKD (chronic kidney disease) stage 4, GFR 15-29 ml/min ([facility]) Followed by nephrology.  Patient had several questions regarding dialysis.  Answered those to the best of my ability but he will need to talk to nephrology for specific details.  6. Need for influenza vaccination Flu vaccine given in office today. - Flu Vaccine QUAD 6+ mos PF IM (Fluarix Quad PF)  Return in about 4 weeks (around 01/20/2022) for DM follow up.  ___________________________________________ Clearnce Sorrel, DNP, APRN, FNP-BC Primary Care and [place]

## 2022-01-03 ENCOUNTER — Telehealth: Payer: Self-pay

## 2022-01-03 NOTE — Telephone Encounter (Signed)
Initiated Prior authorization FRE:[id number] 2.5MG /0.5ML pen-injectors Via: Covermymeds Case/Key:[id number] Status: Pending as of 01/03/22 Reason: Notified Pt via: pt does not have Mychart

## 2022-01-13 ENCOUNTER — Encounter: Payer: Self-pay | Admitting: Medical-Surgical

## 2022-01-20 ENCOUNTER — Ambulatory Visit: Payer: 59 | Admitting: Medical-Surgical

## 2022-01-20 DIAGNOSIS — Z794 Long term (current) use of insulin: Secondary | ICD-10-CM

## 2022-01-31 LAB — HM DIABETES EYE EXAM

## 2022-02-26 ENCOUNTER — Ambulatory Visit: Payer: 59 | Admitting: Internal Medicine

## 2022-02-26 NOTE — Progress Notes (Deleted)
Name: Hunter Villarreal  MRN/ DOB: [id number], [birth year]   Age/ Sex: 42 y.o., male    PCP: Samuel Bouche, NP   Reason for Endocrinology Evaluation: Type 2 Diabetes Mellitus     Date of Initial Endocrinology Visit: 02/26/2022     PATIENT IDENTIFIER: Mr. Hunter Villarreal is a 42 y.o. male with a past medical history of DM, HTN, and dyslipidemia. The patient presented for initial endocrinology clinic visit on 02/26/2022 for consultative assistance with his diabetes management.    HPI: Mr. Neiderhiser was    Diagnosed with DM 2017 Prior Medications tried/Intolerance: *** Currently checking blood sugars *** x / day,  before breakfast and ***.  Hypoglycemia episodes : ***               Symptoms: ***                 Frequency: ***/  Hemoglobin A1c has ranged from 8.3% in 2022, peaking at >14.0% in 2017.   In terms of diet, the patient ***  Patient follows with nephrology and transplant team for CKD  HOME DIABETES REGIMEN: Lantus 20 units daily Mounjaro 2.5 mg weekly   Statin: Yes ACE-I/ARB: Yes Prior Diabetic Education: {Yes/No:11203}   METER DOWNLOAD SUMMARY: Date range evaluated: *** Fingerstick Blood Glucose Tests = *** Average Number Tests/Day = *** Overall Mean FS Glucose = *** Standard Deviation = ***  BG Ranges: Low = *** High = ***   Hypoglycemic Events/30 Days: BG < 50 = *** Episodes of symptomatic severe hypoglycemia = ***   DIABETIC COMPLICATIONS: Microvascular complications:  CKD IV Denies: *** Last eye exam: Completed   Macrovascular complications:   Denies: CAD, PVD, CVA   PAST HISTORY: Past Medical History:  Past Medical History:  Diagnosis Date   Diabetes mellitus without complication ([place])    History of shingles    Hypertension    Past Surgical History:  Past Surgical History:  Procedure Laterality Date   CATARACT EXTRACTION     REPAIR KNEE LIGAMENT      Social History:  reports that he quit smoking about 3 years ago. His smoking use included  cigarettes. He smoked an average of .5 packs per day. He has never been exposed to tobacco smoke. He has never used smokeless tobacco. He reports that he does not currently use alcohol. He reports that he does not use drugs. Family History:  Family History  Problem Relation Age of Onset   Diabetes Mother    Diabetes Brother        type 2   Diabetes Maternal Grandfather    Diabetes Maternal Aunt    Brain cancer Cousin    Breast cancer Cousin      HOME MEDICATIONS: Allergies as of 02/26/2022   No Known Allergies      Medication List        Accurate as of February 26, 2022  7:06 AM. If you have any questions, ask your nurse or doctor.          amLODipine 10 MG tablet Commonly known as: NORVASC Take 10 mg by mouth daily.   atorvastatin 40 MG tablet Commonly known as: LIPITOR Take 1 tablet (40 mg total) by mouth daily.   blood glucose meter kit and supplies Kit Dispense based on patient and insurance preference. Use up to four times daily as directed. Please include lancets, test strips, control solution.   hydrochlorothiazide 12.5 MG tablet Commonly known as: HYDRODIURIL Take 12.5 mg by mouth daily.  Lantus SoloStar 100 UNIT/ML Solostar Pen Generic drug: insulin glargine INJECT 20 UNITS SUBCUTANEOUSLY ONCE DAILY   OneTouch Verio test strip Generic drug: glucose blood 1 each by Other route 2 (two) times daily. And lancets 2/day   tirzepatide 2.5 MG/0.5ML Pen Commonly known as: MOUNJARO Inject 2.5 mg into the skin once a week.   valsartan 160 MG tablet Commonly known as: DIOVAN Take 1 tablet (160 mg total) by mouth daily.   Vitamin D (Ergocalciferol) 1.25 MG (50000 UNIT) Caps capsule Commonly known as: DRISDOL Take 50,000 Units by mouth once a week.         ALLERGIES: No Known Allergies   REVIEW OF SYSTEMS: A comprehensive ROS was conducted with the patient and is negative except as per HPI and below:  ROS    OBJECTIVE:   VITAL SIGNS: There  were no vitals taken for this visit.   PHYSICAL EXAM:  General: Pt appears well and is in NAD  Neck: General: Supple without adenopathy or carotid bruits. Thyroid: Thyroid size normal.  No goiter or nodules appreciated.   Lungs: Clear with good BS bilat with no rales, rhonchi, or wheezes  Heart: RRR   Abdomen:  soft, nontender  Extremities:  Lower extremities - No pretibial edema. No lesions.  Skin: Normal texture and temperature to palpation. No rash noted.  Neuro: MS is good with appropriate affect, pt is alert and Ox3    DM foot exam:    DATA REVIEWED:  Lab Results  Component Value Date   HGBA1C 10.6 (A) 08/02/2021   HGBA1C 8.8 (A) 03/15/2021   HGBA1C 8.3 (A) 09/03/2020    Latest Reference Range & Units 08/02/21 00:00  Sodium 135 - 146 mmol/L 140  Potassium 3.5 - 5.3 mmol/L 5.7 (H)  Chloride 98 - 110 mmol/L 109  CO2 20 - 32 mmol/L 24  Glucose 65 - 99 mg/dL 261 (H)  BUN 7 - 25 mg/dL 36 (H)  Creatinine 0.60 - 1.29 mg/dL 3.72 (H)  Calcium 8.6 - 10.3 mg/dL 8.1 (L)  BUN/Creatinine Ratio 6 - 22 (calc) 10  eGFR > OR = 60 mL/min/1.1m 20 (L)  AG Ratio 1.0 - 2.5 (calc) 1.6  AST 10 - 40 U/L 23  ALT 9 - 46 U/L 22  Total Protein 6.1 - 8.1 g/dL 5.4 (L)  Total Bilirubin 0.2 - 1.2 mg/dL 0.3  Total CHOL/HDL Ratio <5.0 (calc) 3.2  Cholesterol <200 mg/dL 226 (H)  HDL Cholesterol > OR = 40 mg/dL 70  LDL Cholesterol (Calc) mg/dL (calc) 126 (H)  Non-HDL Cholesterol (Calc) <130 mg/dL (calc) 156 (H)  Triglycerides <150 mg/dL 179 (H)  (H): Data is abnormally high (L): Data is abnormally low   ASSESSMENT / PLAN / RECOMMENDATIONS:   1) Type *** Diabetes Mellitus, ***controlled, With CKD IV complications - Most recent A1c of *** %. Goal A1c < 7.0 %.    Plan: GENERAL:  Limited oral glycemic agents due to CKD IV  MEDICATIONS: ***  EDUCATION / INSTRUCTIONS: BG monitoring instructions: Patient is instructed to check his blood sugars *** times a day, ***. [facility] if: BG persistently < 70  I reviewed the Rule of 15 for the treatment of hypoglycemia in detail with the patient. Literature supplied.   2) Diabetic complications:  Eye: Does *** have known diabetic retinopathy.  Neuro/ Feet: Does *** have known diabetic peripheral neuropathy. Renal: Patient does *** have known baseline CKD. He is *** on an ACEI/ARB at present.  3) Lipids:  Patient is *** on a statin.    4) Hypertension: ***  at goal of < 140/90 mmHg.       Signed electronically by: Mack Guise, MD  [facility]  [facility] [address] [place], [place] [address] Phone: [phone] FAX: [phone]   CC: Samuel Bouche, [place] [address] Phone: [phone]  Fax: [phone]    Return to Endocrinology clinic as below: Future Appointments  Date Time Provider [place]  02/26/2022  9:10 AM Rayona Sardinha, Melanie Crazier, MD [facility] None

## 2022-03-03 ENCOUNTER — Telehealth: Payer: Self-pay

## 2022-03-03 ENCOUNTER — Other Ambulatory Visit: Payer: Self-pay | Admitting: Medical-Surgical

## 2022-03-03 NOTE — Telephone Encounter (Signed)
Patient came into office to drop off FMLA paperwork, forms placed in Joy's box, thanks.

## 2022-03-04 ENCOUNTER — Ambulatory Visit (INDEPENDENT_AMBULATORY_CARE_PROVIDER_SITE_OTHER): Payer: 59 | Admitting: Medical-Surgical

## 2022-03-04 ENCOUNTER — Encounter: Payer: Self-pay | Admitting: Medical-Surgical

## 2022-03-04 VITALS — BP 132/81 | HR 85 | Resp 20 | Ht 71.0 in | Wt 154.0 lb

## 2022-03-04 DIAGNOSIS — E1159 Type 2 diabetes mellitus with other circulatory complications: Secondary | ICD-10-CM

## 2022-03-04 DIAGNOSIS — Z0289 Encounter for other administrative examinations: Secondary | ICD-10-CM | POA: Diagnosis not present

## 2022-03-04 DIAGNOSIS — Z794 Long term (current) use of insulin: Secondary | ICD-10-CM | POA: Diagnosis not present

## 2022-03-04 LAB — POCT GLYCOSYLATED HEMOGLOBIN (HGB A1C): Hemoglobin A1C: 6.2 % — AB (ref 4.0–5.6)

## 2022-03-04 MED ORDER — TIRZEPATIDE 2.5 MG/0.5ML ~~LOC~~ SOAJ: 2.5000 mg | SUBCUTANEOUS | 1 refills | Status: DC

## 2022-03-04 MED ORDER — FREESTYLE LIBRE 3 SENSOR MISC
3 refills | Status: DC
Start: 2022-03-04 — End: 2023-08-21

## 2022-03-04 NOTE — Progress Notes (Signed)
   Established Patient Office Visit  Subjective   Patient ID: Hunter Villarreal, male   DOB: July 18, 1980 Age: 42 y.o. MRN: [id number]   Chief Complaint  Patient presents with   Form Completion    FMLA     HPI Pleasant 42 year old male presenting today for the following:  FMLA forms: Working for [employer] and has previously had verbal accommodations with his supervisors but was recently informed that he will need paperwork to allow for time out.  He has had this completed for nephrology already but would like to have paperwork on file for diabetes.  Diabetes: Last hemoglobin A1c 10.6% back in July.  Has not been rechecked since then.  Continues to use Lantus as previously noted.  Started Mounjaro 2.5 mg weekly and has been doing well on this.  Has noted weight loss and a reduction in appetite.  Has not seen any sugars over 200 since starting the [facility].   Objective:    Vitals:   03/04/22 0930 03/04/22 1010  BP: (!) 152/87 132/81  Pulse: 84 85  Resp: 20 20  Height: 5' 11"$  (1.803 m)   Weight: 154 lb (69.9 kg)   SpO2: 100% 100%  BMI (Calculated): 21.49     Physical Exam   Results for orders placed or performed in visit on 03/04/22 (from the past 24 hour(s))  POCT HgB A1C     Status: Abnormal   Collection Time: 03/04/22  9:34 AM  Result Value Ref Range   Hemoglobin A1C 6.2 (A) 4.0 - 5.6 %   HbA1c POC (<> result, manual entry)     HbA1c, POC (prediabetic range)     HbA1c, POC (controlled diabetic range)         The 10-year ASCVD risk score (Arnett DK, et al., 2019) is: 18.7%   Values used to calculate the score:     Age: 80 years     Sex: Male     Is Non-Hispanic African American: Yes     Diabetic: Yes     Tobacco smoker: Yes     Systolic Blood Pressure: [id number] mmHg     Is BP treated: Yes     HDL Cholesterol: 70 mg/dL     Total Cholesterol: 226 mg/dL   Assessment & Plan:   1. Type 2 diabetes mellitus with other circulatory complication, with long-term current use of insulin  (HCC) POCT hemoglobin A1c 6.2% today.  Continue Lantus and Mounjaro as prescribed.  Counseled on the need for small frequent meals to prevent further weight loss with the medication.  He will work on finding healthier calorie dense snacks that are compliant with diabetic diet recommendations.  Because he does have labile blood sugars, would like to start CGM with freestyle libre 3 if insurance will cover it. - POCT HgB A1C  2. Encounter for completion of form with patient [name] forms on hand, reviewed with patient, and completed.  These will be faxed to his employer.  Original records will be placed at the front desk for him to pick up at his convenience.  Return in about 6 months (around 09/02/2022) for DM/HTN/HLD follow up.  ___________________________________________ Clearnce Sorrel, DNP, APRN, FNP-BC Primary Care and [place]

## 2022-06-03 ENCOUNTER — Telehealth: Payer: Self-pay | Admitting: Medical-Surgical

## 2022-06-03 ENCOUNTER — Ambulatory Visit: Payer: 59 | Admitting: Medical-Surgical

## 2022-06-03 NOTE — Telephone Encounter (Signed)
Noted! Thank you

## 2022-06-03 NOTE — Telephone Encounter (Signed)
Pt called. He apologizes for missing his appt. He works 3 shift and went home and went to sleep.

## 2022-06-03 NOTE — Progress Notes (Deleted)
        Established patient visit  History, exam, impression, and plan:  Primary male hypogonadism Up-to-date testosterone labs.  Due for his next testosterone injection.  Doing well on his current regimen with stable vitals.  Testosterone cypionate 100 mg IM given x 1 in the office.  Due for next shot in 2 weeks.  Lump of skin of lower extremity, left Noted a lump on the left posterior calf approximately 1 month ago.  Initially, had redness, tenderness, swelling at the area however this has fully resolved.  No longer has any residual symptoms but the lump has remained.  His wife noticed it and urged him to be seen to evaluate it.  History notable for varicose veins, compliant with recommendations for compression socks daily.  No recent injury or trauma to the area.  Unclear etiology.  The lump is approximately 1 cm x 1.5 cm and slightly discolored.  See clinical photo.  Plan to get vascular ultrasound as it does seem to be connected to one of the varicose veins that runs through the area.  Suspect superficial thrombophlebitis initially.  Discussed conservative measures with Tylenol, heat, ice, and compression socks.    Procedures performed this visit: None.  Return in about 2 weeks (around 04/16/2022) for nurse visit for testosterone shot.  __________________________________ Caitrin Pendergraph L. Jaydence Arnesen, DNP, APRN, FNP-BC Primary Care and Sports Medicine [facility]  

## 2022-06-09 ENCOUNTER — Ambulatory Visit (INDEPENDENT_AMBULATORY_CARE_PROVIDER_SITE_OTHER): Payer: 59 | Admitting: Medical-Surgical

## 2022-06-09 ENCOUNTER — Encounter: Payer: Self-pay | Admitting: Medical-Surgical

## 2022-06-09 VITALS — BP 137/80 | HR 77 | Resp 20 | Ht 71.0 in | Wt 154.0 lb

## 2022-06-09 DIAGNOSIS — I10 Essential (primary) hypertension: Secondary | ICD-10-CM

## 2022-06-09 DIAGNOSIS — E782 Mixed hyperlipidemia: Secondary | ICD-10-CM

## 2022-06-09 DIAGNOSIS — E1159 Type 2 diabetes mellitus with other circulatory complications: Secondary | ICD-10-CM

## 2022-06-09 DIAGNOSIS — N184 Chronic kidney disease, stage 4 (severe): Secondary | ICD-10-CM | POA: Diagnosis not present

## 2022-06-09 DIAGNOSIS — Z794 Long term (current) use of insulin: Secondary | ICD-10-CM

## 2022-06-09 NOTE — Progress Notes (Signed)
        Established patient visit  History, exam, impression, and plan:  1. Type 2 diabetes mellitus with other circulatory complication, with long-term current use of insulin [facility]) Pleasant 42 year old male presenting today to follow-up on diabetes.  Has been taking Mounjaro 2.5 mg weekly, tolerating well without side effects.  Is ordered Lantus 20 units nightly however admits that he is only using this when he is unable to get [facility] for some reason.  Has been checking sugars and notes that his overall blood glucose has been doing well although yesterday was slightly elevated at 140.  Labs checked on 5/1 showed a hemoglobin A1c of 5.8% indicating excellent control.  Continue diabetic diet modifications.  Continue Mounjaro 2.5 mg weekly.  Check fasting glucose at home with a goal of 90-120.  2. CKD (chronic kidney disease) stage 4, GFR 15-29 ml/min (HCC) Managed by nephrology.  Undergoing workup for transplant.  Recent labs and recommendations involve planning for dialysis access given a jump in creatinine/BUN and drastically decreased GFR at 12.  3. Mixed hyperlipidemia Doing well on atorvastatin 40 mg daily, tolerating well without side effects.  Following a low-fat diet and stays physically active at work.  Labs up-to-date.  4. Essential hypertension Taking amlodipine 10 mg daily, hydrochlorothiazide 12.5 mg daily, and valsartan 160 mg daily.  Unfortunately with his reduction in renal function, he was instructed by his nephrologist to have his valsartan dose to 80 mg daily over the weekend.  Today's blood pressure is a little higher than it usually runs although not significantly so.  Plan to follow-up with nephrology for further recommendations and if we need to discontinue valsartan to switch to something else, we can certainly evaluate options.  Monitor blood pressure at home with a goal of 130/80 or less.  Low-sodium diet and regular intentional exercise recommended.  Procedures performed  this visit: None.  Return in about 6 months (around 12/09/2022) for chronic disease follow up.  __________________________________ Thayer Ohm, DNP, APRN, FNP-BC Primary Care and Sports Medicine [facility]

## 2022-07-17 ENCOUNTER — Ambulatory Visit: Payer: 59 | Admitting: Internal Medicine

## 2022-07-17 NOTE — Progress Notes (Deleted)
Name: Hunter Villarreal  MRN/ DOB: [id number], Oct 17, 1980   Age/ Sex: 42 y.o., male    PCP: Christen Butter, NP   Reason for Endocrinology Evaluation: Type 2 Diabetes Mellitus     Date of Initial Endocrinology Visit: 07/17/2022     PATIENT IDENTIFIER: Hunter Villarreal is a 42 y.o. male with a past medical history of DM, dyslipidemia, ESRD on peritoneal dialysis. The patient presented for initial endocrinology clinic visit on 07/17/2022 for consultative assistance with his diabetes management.    HPI: Hunter Villarreal was    Diagnosed with DM 2006 Prior Medications tried/Intolerance: Has been on insulin since 2017 Currently checking blood sugars *** x / day,  before breakfast and ***.  Hypoglycemia episodes : ***               Symptoms: ***                 Frequency: ***/  Hemoglobin A1c has ranged from 6.2% in 2024, peaking at 14.2% in 2020. Patient required assistance for hypoglycemia:  Patient has required hospitalization within the last 1 year from hyper or hypoglycemia:   In terms of diet, the patient ***  Patient was seen by Dr. Everardo All between 2017 and 2020, until his return to our office 07/2022  Patient with ESRD on peritoneal dialysis, undergoing workup for transplant through [facility]  HOME DIABETES REGIMEN: Mounjaro 2.5 mg weekly Lantus 20 units daily  Statin: Yes ACE-I/ARB: Yes    METER DOWNLOAD SUMMARY: Date range evaluated: *** Fingerstick Blood Glucose Tests = *** Average Number Tests/Day = *** Overall Mean FS Glucose = *** Standard Deviation = ***  BG Ranges: Low = *** High = ***   Hypoglycemic Events/30 Days: BG < 50 = *** Episodes of symptomatic severe hypoglycemia = ***   DIABETIC COMPLICATIONS: Microvascular complications:  CKD IV Denies: *** Last eye exam: Completed   Macrovascular complications:  *** Denies: CAD, PVD, CVA   PAST HISTORY: Past Medical History:  Past Medical History:  Diagnosis Date   Diabetes mellitus without complication  (HCC)    History of shingles    Hypertension    Past Surgical History:  Past Surgical History:  Procedure Laterality Date   CATARACT EXTRACTION     REPAIR KNEE LIGAMENT      Social History:  reports that he quit smoking about 3 years ago. His smoking use included cigarettes. He has never been exposed to tobacco smoke. He has never used smokeless tobacco. He reports that he does not currently use alcohol. He reports that he does not use drugs. Family History:  Family History  Problem Relation Age of Onset   Diabetes Mother    Diabetes Brother        type 2   Diabetes Maternal Grandfather    Diabetes Maternal Aunt    Brain cancer Cousin    Breast cancer Cousin      HOME MEDICATIONS: Allergies as of 07/17/2022   No Known Allergies      Medication List        Accurate as of July 17, 2022  7:07 AM. If you have any questions, ask your nurse or doctor.          amLODipine 10 MG tablet Commonly known as: NORVASC Take 10 mg by mouth daily.   atorvastatin 40 MG tablet Commonly known as: LIPITOR Take 1 tablet (40 mg total) by mouth daily.   blood glucose meter kit and supplies Kit Dispense based on  patient and insurance preference. Use up to four times daily as directed. Please include lancets, test strips, control solution.   FreeStyle Libre 3 Sensor Misc Place 1 sensor on the skin every 14 days. Use to check glucose continuously   hydrochlorothiazide 12.5 MG tablet Commonly known as: HYDRODIURIL Take 12.5 mg by mouth daily.   Lantus SoloStar 100 UNIT/ML Solostar Pen Generic drug: insulin glargine INJECT 20 UNITS SUBCUTANEOUSLY ONCE DAILY   OneTouch Verio test strip Generic drug: glucose blood 1 each by Other route 2 (two) times daily. And lancets 2/day   tirzepatide 2.5 MG/0.5ML Pen Commonly known as: MOUNJARO Inject 2.5 mg into the skin once a week.   valsartan 160 MG tablet Commonly known as: DIOVAN Take 1 tablet (160 mg total) by mouth daily.          ALLERGIES: No Known Allergies   REVIEW OF SYSTEMS: A comprehensive ROS was conducted with the patient and is negative except as per HPI and below:  ROS    OBJECTIVE:   VITAL SIGNS: There were no vitals taken for this visit.   PHYSICAL EXAM:  General: Pt appears well and is in NAD  Neck: General: Supple without adenopathy or carotid bruits. Thyroid: Thyroid size normal.  No goiter or nodules appreciated.   Lungs: Clear with good BS bilat   Heart: RRR   Abdomen:  soft, nontender  Extremities:  Lower extremities - No pretibial edema.   Skin:  No rash noted.  Neuro: MS is good with appropriate affect, pt is alert and Ox3    DM foot exam:    DATA REVIEWED:  Lab Results  Component Value Date   HGBA1C 6.2 (A) 03/04/2022   HGBA1C 10.6 (A) 08/02/2021   HGBA1C 8.8 (A) 03/15/2021    06/26/2022 K 5.5 Na 141 BUN 69 CR 5.09 GFR 14   ASSESSMENT / PLAN / RECOMMENDATIONS:   1) Type 2 Diabetes Mellitus, ***controlled, With ESRD Complications - Most recent A1c of *** %. Goal A1c < 7.0 %.    Plan: GENERAL: ***  MEDICATIONS: ***  EDUCATION / INSTRUCTIONS: BG monitoring instructions: Patient is instructed to check his blood sugars *** times a day, ***. Call [place] [facility] clinic if: BG persistently < 70  I reviewed the Rule of 15 for the treatment of hypoglycemia in detail with the patient. Literature supplied.   2) Diabetic complications:  Eye: Does *** have known diabetic retinopathy.  Neuro/ Feet: Does *** have known diabetic peripheral neuropathy. Renal: Patient does *** have known baseline CKD. He is *** on an ACEI/ARB at present.  3) Lipids: Patient is *** on a statin.    4) Hypertension: ***  at goal of < 140/90 mmHg.       Signed electronically by: Lyndle Herrlich, MD  [facility]  [facility] [address] [place], [place] [address] Phone: [phone] FAX: [phone]   CC: Christen Butter,  NP [address] [place] [address] Phone: [phone]  Fax: [phone]    Return to Endocrinology clinic as below: Future Appointments  Date Time Provider Department Center  07/17/2022  9:30 AM , Konrad Dolores, MD [facility] None  12/09/2022  9:30 AM Christen Butter, NP [facility] None

## 2022-08-08 ENCOUNTER — Encounter: Payer: Self-pay | Admitting: Medical-Surgical

## 2022-08-08 ENCOUNTER — Ambulatory Visit: Payer: 59 | Admitting: Internal Medicine

## 2022-09-01 LAB — COMPREHENSIVE METABOLIC PANEL: eGFR: 13

## 2022-09-22 ENCOUNTER — Ambulatory Visit (INDEPENDENT_AMBULATORY_CARE_PROVIDER_SITE_OTHER): Payer: 59 | Admitting: Medical-Surgical

## 2022-09-22 DIAGNOSIS — Z91199 Patient's noncompliance with other medical treatment and regimen due to unspecified reason: Secondary | ICD-10-CM

## 2022-09-22 NOTE — Progress Notes (Unsigned)
   Complete physical exam  Patient: Hunter Villarreal   DOB: 10/26/1999   42 y.o. Male  MRN: [id number]  Subjective:    No chief complaint on file.   Hunter Villarreal is a 42 y.o. male who presents today for a complete physical exam. She reports consuming a {diet types:17450} diet. {types:19826} She generally feels {DESC; WELL/FAIRLY WELL/POORLY:18703}. She reports sleeping {DESC; WELL/FAIRLY WELL/POORLY:18703}. She {does/does not:200015} have additional problems to discuss today.    Most recent fall risk assessment:    07/03/2021   10:42 AM  Fall Risk   Falls in the past year? 0  Number falls in past yr: 0  Injury with Fall? 0  Risk for fall due to : No Fall Risks  Follow up Falls evaluation completed     Most recent depression screenings:    07/03/2021   10:42 AM 05/24/2020   10:46 AM  PHQ 2/9 Scores  PHQ - 2 Score 0 0  PHQ- 9 Score 5     {VISON DENTAL STD PSA (Optional):27386}  {History (Optional):23778}  Patient Care Team: Jamire Shabazz, NP as PCP - General (Nurse Practitioner)   Outpatient Medications Prior to Visit  Medication Sig   fluticasone (FLONASE) 50 MCG/ACT nasal spray Place 2 sprays into both nostrils in the morning and at bedtime. After 7 days, reduce to once daily.   norgestimate-ethinyl estradiol (SPRINTEC 28) 0.25-35 MG-MCG tablet Take 1 tablet by mouth daily.   Nystatin POWD Apply liberally to affected area 2 times per day   spironolactone (ALDACTONE) 100 MG tablet Take 1 tablet (100 mg total) by mouth daily.   No facility-administered medications prior to visit.    ROS        Objective:     There were no vitals taken for this visit. {Vitals History (Optional):23777}  Physical Exam   No results found for any visits on 08/08/21. {Show previous labs (optional):23779}    Assessment & Plan:    Routine Health Maintenance and Physical Exam  Immunization History  Administered Date(s) Administered   DTaP 01/09/1999, 03/07/1999,  05/16/1999, 01/30/2000, 08/15/2003   Hepatitis A 06/11/2007, 06/16/2008   Hepatitis B 10/27/1998, 12/04/1998, 05/16/1999   HiB (PRP-OMP) 01/09/1999, 03/07/1999, 05/16/1999, 01/30/2000   IPV 01/09/1999, 03/07/1999, 11/04/1999, 08/15/2003   Influenza,inj,Quad PF,6+ Mos 09/16/2013   Influenza-Unspecified 12/17/2011   MMR 11/03/2000, 08/15/2003   Meningococcal Polysaccharide 06/16/2011   Pneumococcal Conjugate-13 01/30/2000   Pneumococcal-Unspecified 05/16/1999, 07/30/1999   Tdap 06/16/2011   Varicella 11/04/1999, 06/11/2007    Health Maintenance  Topic Date Due   HIV Screening  Never done   Hepatitis C Screening  Never done   INFLUENZA VACCINE  08/06/2021   PAP-Cervical Cytology Screening  08/08/2021 (Originally 10/26/2019)   PAP SMEAR-Modifier  08/08/2021 (Originally 10/26/2019)   TETANUS/TDAP  08/08/2021 (Originally 06/15/2021)   HPV VACCINES  Discontinued   COVID-19 Vaccine  Discontinued    Discussed health benefits of physical activity, and encouraged her to engage in regular exercise appropriate for her age and condition.  Problem List Items Addressed This Visit   None Visit Diagnoses     Annual physical exam    -  Primary   Cervical cancer screening       Need for Tdap vaccination          No follow-ups on file.     Shara Hartis, NP   

## 2022-11-25 DIAGNOSIS — K4090 Unilateral inguinal hernia, without obstruction or gangrene, not specified as recurrent: Secondary | ICD-10-CM | POA: Insufficient documentation

## 2022-12-08 ENCOUNTER — Ambulatory Visit: Payer: 59 | Admitting: Internal Medicine

## 2022-12-08 NOTE — Progress Notes (Unsigned)
Name: Hunter Villarreal  MRN/ DOB: [id number], 07-29-1980   Age/ Sex: 42 y.o., male    PCP: Hunter Butter, NP   Reason for Endocrinology Evaluation: Type 2 Diabetes Mellitus     Date of Initial Endocrinology Visit: 12/08/2022     PATIENT IDENTIFIER: Hunter Villarreal is a 42 y.o. male with a past medical history of DM, HTN, dyslipidemia, CKD. The patient presented for initial endocrinology clinic visit on 12/08/2022 for consultative assistance with his diabetes management.    HPI: Hunter Villarreal was    Diagnosed with DM *** Prior Medications tried/Intolerance: *** Currently checking blood sugars *** x / day,  before breakfast and ***.  Hypoglycemia episodes : ***               Symptoms: ***                 Frequency: ***/  Hemoglobin A1c has ranged from 6.2% in 2024, peaking at 10.6% in 2023.  In terms of diet, the patient ***   HOME DIABETES REGIMEN: Lantus  Mounjaro 2.5 mg weekly    Statin: yes ACE-I/ARB: yes   METER DOWNLOAD SUMMARY: Date range evaluated: *** Fingerstick Blood Glucose Tests = *** Average Number Tests/Day = *** Overall Mean FS Glucose = *** Standard Deviation = ***  BG Ranges: Low = *** High = ***   Hypoglycemic Events/30 Days: BG < 50 = *** Episodes of symptomatic severe hypoglycemia = ***   DIABETIC COMPLICATIONS: Microvascular complications:  *** Denies: *** Last eye exam: Completed   Macrovascular complications:  *** Denies: CAD, PVD, CVA   PAST HISTORY: Past Medical History:  Past Medical History:  Diagnosis Date   Diabetes mellitus without complication (HCC)    History of shingles    Hypertension    Past Surgical History:  Past Surgical History:  Procedure Laterality Date   CATARACT EXTRACTION     REPAIR KNEE LIGAMENT      Social History:  reports that he quit smoking about 3 years ago. His smoking use included cigarettes. He has never been exposed to tobacco smoke. He has never used smokeless tobacco. He reports that he does not  currently use alcohol. He reports that he does not use drugs. Family History:  Family History  Problem Relation Age of Onset   Diabetes Mother    Diabetes Brother        type 2   Diabetes Maternal Grandfather    Diabetes Maternal Aunt    Brain cancer Cousin    Breast cancer Cousin      HOME MEDICATIONS: Allergies as of 12/08/2022   No Known Allergies      Medication List        Accurate as of December 08, 2022  7:40 AM. If you have any questions, ask your nurse or doctor.          amLODipine 10 MG tablet Commonly known as: NORVASC Take 10 mg by mouth daily.   atorvastatin 40 MG tablet Commonly known as: LIPITOR Take 1 tablet (40 mg total) by mouth daily.   blood glucose meter kit and supplies Kit Dispense based on patient and insurance preference. Use up to four times daily as directed. Please include lancets, test strips, control solution.   FreeStyle Libre 3 Sensor Misc Place 1 sensor on the skin every 14 days. Use to check glucose continuously   hydrochlorothiazide 12.5 MG tablet Commonly known as: HYDRODIURIL Take 12.5 mg by mouth daily.   Lantus SoloStar  100 UNIT/ML Solostar Pen Generic drug: insulin glargine INJECT 20 UNITS SUBCUTANEOUSLY ONCE DAILY   OneTouch Verio test strip Generic drug: glucose blood 1 each by Other route 2 (two) times daily. And lancets 2/day   tirzepatide 2.5 MG/0.5ML Pen Commonly known as: MOUNJARO Inject 2.5 mg into the skin once a week.   valsartan 160 MG tablet Commonly known as: DIOVAN Take 1 tablet (160 mg total) by mouth daily.         ALLERGIES: No Known Allergies   REVIEW OF SYSTEMS: A comprehensive ROS was conducted with the patient and is negative except as per HPI and below:  ROS    OBJECTIVE:   VITAL SIGNS: There were no vitals taken for this visit.   PHYSICAL EXAM:  General: Pt appears well and is in NAD  Neck: General: Supple without adenopathy or carotid bruits. Thyroid: Thyroid size  normal.  No goiter or nodules appreciated.   Lungs: Clear with good BS bilat   Heart: RRR   Abdomen:  soft, nontender  Extremities:  Lower extremities - No pretibial edema.   Skin:  No rash noted.  Neuro: MS is good with appropriate affect, pt is alert and Ox3    DM foot exam:    DATA REVIEWED:  Lab Results  Component Value Date   HGBA1C 6.2 (A) 03/04/2022   HGBA1C 10.6 (A) 08/02/2021   HGBA1C 8.8 (A) 03/15/2021   Lab Results  Component Value Date   MICROALBUR 150 08/02/2021   LDLCALC 126 (H) 08/02/2021   CREATININE 3.72 (H) 08/02/2021   Lab Results  Component Value Date   MICRALBCREAT 30-300 08/02/2021    Lab Results  Component Value Date   CHOL 226 (H) 08/02/2021   HDL 70 08/02/2021   LDLCALC 126 (H) 08/02/2021   TRIG 179 (H) 08/02/2021   CHOLHDL 3.2 08/02/2021        ASSESSMENT / PLAN / RECOMMENDATIONS:   1) Type *** Diabetes Mellitus, ***controlled, With*** complications - Most recent A1c of *** %. Goal A1c < *** %.  ***  Plan: GENERAL: ***  MEDICATIONS: ***  EDUCATION / INSTRUCTIONS: BG monitoring instructions: Patient is instructed to check his blood sugars *** times a day, ***. Call [facility] if: BG persistently < 70  I reviewed the Rule of 15 for the treatment of hypoglycemia in detail with the patient. Literature supplied.   2) Diabetic complications:  Eye: Does *** have known diabetic retinopathy.  Neuro/ Feet: Does *** have known diabetic peripheral neuropathy. Renal: Patient does *** have known baseline CKD. He is *** on an ACEI/ARB at present.  3) Lipids: Patient is *** on a statin.    4) Hypertension: ***  at goal of < 140/90 mmHg.       Signed electronically by: Hunter Herrlich, MD  [facility]  [facility] [address] [place], [place] [address] Phone: [phone] FAX: [phone]   CC: Hunter Butter, NP [address] [place]  [address] Phone: [phone]  Fax: [phone]    Return to Endocrinology clinic as below: Future Appointments  Date Time Provider Department Center  12/08/2022  2:00 PM Hunter Villarreal, Hunter Dolores, MD [facility] None  12/09/2022  9:30 AM Hunter Butter, NP [facility] None

## 2022-12-09 ENCOUNTER — Ambulatory Visit: Payer: 59 | Admitting: Medical-Surgical

## 2023-01-09 LAB — HEMOGLOBIN A1C: Hemoglobin A1C: 8.7

## 2023-03-01 ENCOUNTER — Other Ambulatory Visit: Payer: Self-pay | Admitting: Medical-Surgical

## 2023-03-10 ENCOUNTER — Ambulatory Visit (INDEPENDENT_AMBULATORY_CARE_PROVIDER_SITE_OTHER): Payer: 59 | Admitting: Medical-Surgical

## 2023-03-10 ENCOUNTER — Encounter: Payer: Self-pay | Admitting: Medical-Surgical

## 2023-03-10 VITALS — BP 135/76 | HR 82 | Resp 20 | Ht 71.0 in | Wt 157.0 lb

## 2023-03-10 DIAGNOSIS — N186 End stage renal disease: Secondary | ICD-10-CM | POA: Diagnosis not present

## 2023-03-10 DIAGNOSIS — Z794 Long term (current) use of insulin: Secondary | ICD-10-CM | POA: Diagnosis not present

## 2023-03-10 DIAGNOSIS — E1159 Type 2 diabetes mellitus with other circulatory complications: Secondary | ICD-10-CM

## 2023-03-10 DIAGNOSIS — Z0000 Encounter for general adult medical examination without abnormal findings: Secondary | ICD-10-CM

## 2023-03-10 DIAGNOSIS — I10 Essential (primary) hypertension: Secondary | ICD-10-CM | POA: Diagnosis not present

## 2023-03-10 DIAGNOSIS — Z992 Dependence on renal dialysis: Secondary | ICD-10-CM

## 2023-03-10 DIAGNOSIS — Z23 Encounter for immunization: Secondary | ICD-10-CM

## 2023-03-10 HISTORY — DX: End stage renal disease: N18.6

## 2023-03-10 LAB — POCT UA - MICROALBUMIN
Creatinine, POC: 200 mg/dL
Microalbumin Ur, POC: 150 mg/L

## 2023-03-10 MED ORDER — MOUNJARO 2.5 MG/0.5ML ~~LOC~~ SOAJ: 2.5000 mg | SUBCUTANEOUS | 1 refills | Status: DC

## 2023-03-10 MED ORDER — MOUNJARO 2.5 MG/0.5ML ~~LOC~~ SOAJ: 2.5000 mg | SUBCUTANEOUS | 0 refills | Status: DC

## 2023-03-10 NOTE — Patient Instructions (Signed)

## 2023-03-10 NOTE — Progress Notes (Unsigned)
 Complete physical exam  Patient: Hunter Villarreal   DOB: 06/28/1980   43 y.o. Male  MRN: [id number]  Subjective:    Chief Complaint  Patient presents with   Annual Exam   Diabetes    Hunter Villarreal is a 43 y.o. male who presents today for a complete physical exam. He reports consuming a  diabetic, renal failure  diet. The patient does not participate in regular exercise at present. He generally feels fairly well. He reports sleeping well. He does not have additional problems to discuss today.   Most recent fall risk assessment:    03/04/2022    9:33 AM  Fall Risk   Falls in the past year? 0  Number falls in past yr: 0  Injury with Fall? 0  Risk for fall due to : No Fall Risks  Follow up Falls evaluation completed     Most recent depression screenings:    03/10/2023   11:10 AM 03/04/2022    9:33 AM  PHQ 2/9 Scores  PHQ - 2 Score 2 0  PHQ- 9 Score 7     Vision:Within last year, Dental: No current dental problems and No regular dental care , and STD: The patient denies history of sexually transmitted disease.    Patient Care Team: Christen Butter, NP as PCP - General (Nurse Practitioner)   Outpatient Medications Prior to Visit  Medication Sig   amLODipine (NORVASC) 10 MG tablet Take 10 mg by mouth daily.   atorvastatin (LIPITOR) 40 MG tablet Take 1 tablet (40 mg total) by mouth daily.   blood glucose meter kit and supplies KIT Dispense based on patient and insurance preference. Use up to four times daily as directed. Please include lancets, test strips, control solution.   Continuous Blood Gluc Sensor (FREESTYLE LIBRE 3 SENSOR) MISC Place 1 sensor on the skin every 14 days. Use to check glucose continuously   glucose blood (ONETOUCH VERIO) test strip 1 each by Other route 2 (two) times daily. And lancets 2/day   hydrochlorothiazide (HYDRODIURIL) 12.5 MG tablet Take 12.5 mg by mouth daily.   LANTUS SOLOSTAR 100 UNIT/ML Solostar Pen INJECT 20 UNITS SUBCUTANEOUSLY ONCE DAILY    valsartan (DIOVAN) 160 MG tablet Take 1 tablet (160 mg total) by mouth daily.   [DISCONTINUED] tirzepatide (MOUNJARO) 2.5 MG/0.5ML Pen INJECT 2.5 MG INTO THE SKIN ONCE A WEEK   No facility-administered medications prior to visit.    Review of Systems  Constitutional:  Positive for malaise/fatigue. Negative for chills, fever and weight loss.  HENT:  Negative for congestion, ear pain, hearing loss, sinus pain and sore throat.   Eyes:  Negative for blurred vision, photophobia and pain.  Respiratory:  Negative for cough, shortness of breath and wheezing.   Cardiovascular:  Negative for chest pain, palpitations and leg swelling.  Gastrointestinal:  Negative for abdominal pain, constipation, diarrhea, heartburn, nausea and vomiting.  Genitourinary:  Negative for dysuria, frequency and urgency.  Musculoskeletal:  Negative for falls and neck pain.  Skin:  Negative for itching and rash.       Pruritus   Neurological:  Negative for dizziness, weakness and headaches.  Endo/Heme/Allergies:  Negative for polydipsia. Does not bruise/bleed easily.  Psychiatric/Behavioral:  Negative for depression, substance abuse and suicidal ideas. The patient is not nervous/anxious and does not have insomnia.      Objective:    BP 135/76 (BP Location: Left Arm, Cuff Size: Normal)   Pulse 82   Resp 20   Ht  5\' 11"  (1.803 m)   Wt 157 lb (71.2 kg)   SpO2 98%   BMI 21.90 kg/m    Physical Exam Constitutional:      General: He is not in acute distress.    Appearance: Normal appearance. He is not ill-appearing.  HENT:     Head: Normocephalic and atraumatic.     Right Ear: Tympanic membrane, ear canal and external ear normal. There is no impacted cerumen.     Left Ear: Tympanic membrane, ear canal and external ear normal. There is no impacted cerumen.     Nose: Nose normal. No congestion or rhinorrhea.     Mouth/Throat:     Mouth: Mucous membranes are moist.     Pharynx: No oropharyngeal exudate or posterior  oropharyngeal erythema.  Eyes:     General: No scleral icterus.       Right eye: No discharge.        Left eye: No discharge.     Extraocular Movements: Extraocular movements intact.     Conjunctiva/sclera: Conjunctivae normal.     Pupils: Pupils are equal, round, and reactive to light.  Neck:     Thyroid: No thyromegaly.     Vascular: No carotid bruit or JVD.     Trachea: Trachea normal.  Cardiovascular:     Rate and Rhythm: Normal rate and regular rhythm.     Pulses: Normal pulses.     Heart sounds: Normal heart sounds. No murmur heard.    No friction rub. No gallop.  Pulmonary:     Effort: Pulmonary effort is normal. No respiratory distress.     Breath sounds: Normal breath sounds. No wheezing.  Abdominal:     General: Bowel sounds are normal. There is no distension.     Palpations: Abdomen is soft.     Tenderness: There is no abdominal tenderness. There is no guarding.     Comments: Peritoneal catheter in place, capped, secured with mesh gauze.  Musculoskeletal:        General: Normal range of motion.     Cervical back: Normal range of motion and neck supple.  Lymphadenopathy:     Cervical: No cervical adenopathy.  Skin:    General: Skin is warm and dry.  Neurological:     Mental Status: He is alert and oriented to person, place, and time.     Cranial Nerves: No cranial nerve deficit.  Psychiatric:        Mood and Affect: Mood normal.        Behavior: Behavior normal.        Thought Content: Thought content normal.        Judgment: Judgment normal.    Results for orders placed or performed in visit on 03/10/23  Hemoglobin A1c  Result Value Ref Range   Hemoglobin A1C 8.7   POCT UA - Microalbumin  Result Value Ref Range   Microalbumin Ur, POC 150 mg/L   Creatinine, POC 200 mg/dL   Albumin/Creatinine Ratio, Urine, POC 30-300        Assessment & Plan:    Routine Health Maintenance and Physical Exam  Immunization History  Administered Date(s) Administered    Influenza,inj,Quad PF,6+ Mos 12/23/2021   Influenza-Unspecified 11/10/2022   PFIZER(Purple Top)SARS-COV-2 Vaccination 04/15/2019, 05/06/2019   PNEUMOCOCCAL CONJUGATE-20 03/10/2023   Tdap 11/03/2019    Health Maintenance  Topic Date Due   OPHTHALMOLOGY EXAM  02/01/2023   COVID-19 Vaccine (3 - 2024-25 season) 03/26/2023 (Originally 09/07/2022)   HEMOGLOBIN A1C  07/09/2023   FOOT EXAM  03/09/2024   DTaP/Tdap/Td (2 - Td or Tdap) 11/02/2029   Pneumococcal Vaccine 50-68 Years old  Completed   INFLUENZA VACCINE  Completed   Hepatitis C Screening  Completed   HIV Screening  Completed   HPV VACCINES  Aged Out    Discussed health benefits of physical activity, and encouraged him to engage in regular exercise appropriate for his age and condition.  1. Annual physical exam (Primary) Deferring labs. On the renal transplant list and having blood work checked every month or so. Requested he ask to have lipids drawn at his next lab appointment. Recommend updating dental care. Wellness information given with AVS.   2. Type 2 diabetes mellitus with other circulatory complication, with long-term current use of insulin (HCC) Recent A1c at 8.7% indicating poor control of diabetes but suspect this is due to starting peritoneal dialysis and the dextrose content of the exchanges. Discussed adjusting his Mounjaro dose but he is hesitant due to difficulty maintaining his weight. He would like to see what his next A1c is in 1 month and if still elevated, may consider switching to Ozempic as this has slightly less of a risk for further weight loss on a higher dose. Urine microalbumin abnormal today in the setting of ESRD and uncontrolled diabetes. Foot exam completed with poor sensation over the heels bilaterally, complicated by thick calluses.  - POCT UA - Microalbumin - HM Diabetes Foot Exam  3. Essential hypertension BP at goal today. Continue Valsartan and Amlodipine. Managed by Nephrology.   4. ESRD (end  stage renal disease) on dialysis [facility]) Managed by Nephrology. Tolerating PD well. Very knowledgeable about treatment and self care.   5. Need for pneumococcal 20-valent conjugate vaccination Prevnar 20 given in office today.  - Pneumococcal conjugate vaccine 20-valent (Prevnar 20)  Return in about 1 year (around 03/09/2024) for nurse visit for POCT A1c check.     Christen Butter, NP

## 2023-04-14 ENCOUNTER — Other Ambulatory Visit (HOSPITAL_COMMUNITY): Payer: Self-pay

## 2023-04-14 ENCOUNTER — Ambulatory Visit (INDEPENDENT_AMBULATORY_CARE_PROVIDER_SITE_OTHER): Admitting: Medical-Surgical

## 2023-04-14 ENCOUNTER — Telehealth: Payer: Self-pay

## 2023-04-14 DIAGNOSIS — E1159 Type 2 diabetes mellitus with other circulatory complications: Secondary | ICD-10-CM | POA: Diagnosis not present

## 2023-04-14 DIAGNOSIS — Z794 Long term (current) use of insulin: Secondary | ICD-10-CM | POA: Diagnosis not present

## 2023-04-14 LAB — POCT GLYCOSYLATED HEMOGLOBIN (HGB A1C): Hemoglobin A1C: 8.5 % — AB (ref 4.0–5.6)

## 2023-04-14 MED ORDER — TIRZEPATIDE 5 MG/0.5ML ~~LOC~~ SOAJ: 5.0000 mg | SUBCUTANEOUS | 1 refills | Status: DC

## 2023-04-14 NOTE — Progress Notes (Signed)
 Pt here for A1C check last A1C 8.7 01/09/23.                        Pt A1C today is 8.5. Per Christen Butter, NP she would like to increase his mounjaro from 2.5 to 5mg . Pt agrees with the plan.

## 2023-04-14 NOTE — Telephone Encounter (Signed)
 Pharmacy Patient Advocate Encounter  Received notification from [facility] that Prior Authorization for [facility] 5 has been APPROVED from 04/14/23 to 04/13/24. Ran test claim, Copay is $25.00 for a 28 day supply. This test claim was processed through [facility]- copay amounts may vary at other pharmacies due to pharmacy/plan contracts, or as the patient moves through the different stages of their insurance plan.   PA #/Case ID/Reference #: [id number]

## 2023-04-14 NOTE — Telephone Encounter (Signed)
 Pharmacy Patient Advocate Encounter   Received notification from Patient Pharmacy that prior authorization for Mounjaro 12.5 is required/requested.   Insurance verification completed.   The patient is insured through  [place]  .   Per test claim: PA required; PA submitted to above mentioned insurance via CoverMyMeds Key/confirmation #/EOC [id number] Status is pending

## 2023-04-15 ENCOUNTER — Other Ambulatory Visit (HOSPITAL_COMMUNITY): Payer: Self-pay

## 2023-05-05 LAB — HM DIABETES EYE EXAM

## 2023-05-12 ENCOUNTER — Telehealth: Payer: Self-pay

## 2023-05-12 NOTE — Telephone Encounter (Signed)
 Patient was identified as falling into the [place] Measure - Diabetes.   Patient was: Appointment already scheduled for:  07/14/2023.

## 2023-07-05 DIAGNOSIS — Z940 Kidney transplant status: Secondary | ICD-10-CM | POA: Insufficient documentation

## 2023-07-05 DIAGNOSIS — D849 Immunodeficiency, unspecified: Secondary | ICD-10-CM | POA: Insufficient documentation

## 2023-07-06 ENCOUNTER — Telehealth: Payer: Self-pay | Admitting: *Deleted

## 2023-07-06 DIAGNOSIS — Z940 Kidney transplant status: Secondary | ICD-10-CM

## 2023-07-06 NOTE — Transitions of Care (Post Inpatient/ED Visit) (Signed)
 07/06/2023  Name: Hunter Villarreal MRN: [id number] DOB: Apr 01, 1980  Today's TOC FU Call Status: Today's TOC FU Call Status:: Successful TOC FU Call Completed TOC FU Call Complete Date: 07/06/23 Patient's Name and Date of Birth confirmed.  Transition Care Management Follow-up Telephone Call Date of Discharge: 07/05/23 Discharge Facility: Other [facility]) Name of Other (Non[facility]) Discharge Facility: [facility] Type of Discharge: Inpatient Admission Primary Inpatient Discharge Diagnosis:: Chronic renal failure living unrelated kidney donor How have you been since you were released from the hospital?: Better Any questions or concerns?: No  Items Reviewed: Did you receive and understand the discharge instructions provided?: Yes Medications obtained,verified, and reconciled?: Yes (Medications Reviewed) Any new allergies since your discharge?: No Dietary orders reviewed?: No Do you have support at home?: Yes People in Home [RPT]: significant other Name of Support/Comfort Primary Source: Camie  Medications Reviewed Today: Medications Reviewed Today     Reviewed by Kennieth Cathlean DEL, RN (Case Manager) on 07/06/23 at 1637  Med List Status: <None>   Medication Order Taking? Sig Documenting Provider Last Dose Status Informant  amLODipine (NORVASC) 10 MG tablet [id number] Yes Take 10 mg by mouth daily. [provider]  Active   aspirin EC 81 MG tablet [id number] Yes Take 81 mg by mouth daily. Swallow whole. [provider]  Active   atorvastatin  (LIPITOR) 40 MG tablet [id number]  Take 1 tablet (40 mg total) by mouth daily. Willo Mini, NP  Active   blood glucose meter kit and supplies KIT [id number]  Dispense based on patient and insurance preference. Use up to four times daily as directed. Please include lancets, test strips, control solution. Willo Mini, NP  Active   Continuous Blood Gluc Sensor (FREESTYLE LIBRE 3 SENSOR) [place] [id number]  Place 1  sensor on the skin every 14 days. Use to check glucose continuously  Patient not taking: Reported on 07/06/2023   Willo Mini, NP  Active   Continuous Glucose Receiver [facility] G7 RECEIVER) ESPIRIDION [id number]  by Does not apply route. [provider]  Active   Continuous Glucose Receiver [facility] G7 RECEIVER) DEVI [id number] Yes by Does not apply route. [provider]  Active   Continuous Glucose Sensor (DEXCOM G7 SENSOR) MISC [id number] Yes by Does not apply route. [provider]  Active   famotidine (PEPCID) 20 MG tablet [id number] Yes Take 20 mg by mouth at bedtime. [provider]  Active   fluconazole (DIFLUCAN) 50 MG tablet [id number] Yes Take 50 mg by mouth daily. [provider]  Active   glucose blood (ONETOUCH VERIO) test strip [id number]  1 each by Other route 2 (two) times daily. And lancets 2/day Kassie Mallick, MD  Active   hydrochlorothiazide (HYDRODIURIL) 12.5 MG tablet [id number] Yes Take 12.5 mg by mouth daily. [provider]  Active   insulin  aspart (NOVOLOG ) 100 UNIT/ML FlexPen [id number] Yes Inject 6 Units into the skin 3 (three) times daily with meals. [provider]  Active   Insulin  Pen Needle (BD PEN NEEDLE NANO 2ND GEN) 32G X 4 MM MISC [id number] Yes by Does not apply route. [provider]  Active   LANTUS  SOLOSTAR 100 UNIT/ML Solostar Pen [id number] Yes INJECT 20 UNITS SUBCUTANEOUSLY ONCE DAILY Jessup, Joy, NP  Active   mycophenolate (MYFORTIC) 180 MG EC tablet [id number] Yes Take 180 mg by mouth 2 (two) times daily. [provider]  Active   polyethylene glycol (MIRALAX / GLYCOLAX) 17 g packet [id number] Yes Take 17 g  by mouth daily as needed for mild constipation or moderate constipation. [provider]  Active   predniSONE (DELTASONE) 5 MG tablet [id number] Yes Take 20 mg by mouth daily with breakfast. [provider]  Active   senna-docusate (SENOKOT-S) 8.6-50 MG tablet [id number] Yes  Take 1 tablet by mouth 2 (two) times daily as needed for mild constipation. [provider]  Active   sulfamethoxazole -trimethoprim  (BACTRIM ) 400-80 MG tablet [id number] Yes Take 1 tablet by mouth 3 (three) times a week. [provider]  Active            Med Note LORICE CATHLEAN VEAR Pablo Jul 06, 2023  4:22 PM) Take 1 tablet by mouth every Monday, Wednesday and Friday  tirzepatide  (MOUNJARO ) 5 MG/0.5ML Pen [id number]  Inject 5 mg into the skin once a week.  Patient not taking: Reported on 07/06/2023   Willo Mini, NP  Active            Med Note LORICE CATHLEAN VEAR Pablo Jul 06, 2023  4:24 PM) Wait to take this until your doctor or other care provider tells you to start again  valGANciclovir (VALCYTE) 450 MG tablet [id number] Yes Take 450 mg by mouth daily. [provider]  Active   valsartan  (DIOVAN ) 160 MG tablet [id number] Yes Take 1 tablet (160 mg total) by mouth daily. Willo Mini, NP  Active             Home Care and Equipment/Supplies: Were Home Health Services Ordered?: NA Any new equipment or medical supplies ordered?: NA  Functional Questionnaire: Do you need assistance with bathing/showering or dressing?: Yes Do you need assistance with meal preparation?: No Do you need assistance with eating?: No Do you have difficulty maintaining continence: No Do you need assistance with getting out of bed/getting out of a chair/moving?: No Do you have difficulty managing or taking your medications?: No  Follow up appointments reviewed: PCP Follow-up appointment confirmed?: Yes Date of PCP follow-up appointment?: 07/14/23 Follow-up Provider: Mini Willo Specialist [facility] Follow-up appointment confirmed?: Yes Date of Specialist follow-up appointment?: 07/07/23 Follow-Up Specialty Provider:: Transplant Clinic Do you need transportation to your follow-up appointment?: No Do you understand care options if your condition(s) worsen?: Yes-patient verbalized  understanding  SDOH Interventions Today    Flowsheet Row Most Recent Value  SDOH Interventions   Food Insecurity Interventions Intervention Not Indicated  Housing Interventions Intervention Not Indicated  Transportation Interventions Intervention Not Indicated, Patient Resources (Friends/Family)  Utilities Interventions Intervention Not Indicated    Goals Addressed             This Visit's Progress    VBCI Transitions of Care (TOC) Care Plan       Problems:  Recent Hospitalization for treatment of CKD Stage 4/ living-unrelated kidney transplant Knowledge Deficit Related to Kidney transplant  Goal:  Over the next 30 days, the patient will not experience hospital readmission  Interventions:    Chronic Kidney Disease Interventions: Evaluation of current treatment plan related to chronic kidney disease self management and patient's adherence to plan as established by provider      Reviewed medications with patient and discussed importance of compliance    Monitor vital signs, urine output, drain output, weight and regular outpatient follow up, as scheduled. Bring log book to each clinic visit with recorded blood pressure, pulse, weight, temp, urine output, intake amount (how much you drink each day), drain output and blood sugars if diabetic.  Last practice recorded BP readings:  BP Readings from Last 3 Encounters:  03/10/23 135/76  06/09/22 137/80  03/04/22 132/81   Most recent eGFR/CrCl:  Lab Results  Component Value Date   EGFR 13 09/01/2022    No components found for: CRCL  Patient Self Care Activities:  Call pharmacy for medication refills 3-7 days in advance of running out of medications Call provider office for new concerns or questions  Notify RN Care Manager of TOC call rescheduling needs Participate in Transition of Care Program/Attend TOC scheduled calls Perform all self care activities independently  Take medications as prescribed    Plan:  An initial  telephone outreach has been scheduled for: [id number] Next PCP appointment scheduled for: [id number] Telephone follow up appointment with care management team member scheduled [id number]  Shona Prow       The patient has been provided with contact information for the care management team and has been advised to call with any health-related questions or concerns. The patient verbalized understanding with current POC. The patient is directed to their insurance card regarding availability of benefits coverage   Cathlean Headland BSN RN [facility] Health Care Management Coordinator [email or web address] Direct Dial: [phone]  Fax: [phone] Website: [email or web address]

## 2023-07-08 NOTE — Addendum Note (Signed)
 Addended by: KENNIETH CATHLEAN DEL on: 07/08/2023 05:03 PM   Modules accepted: Orders

## 2023-07-09 ENCOUNTER — Telehealth: Payer: Self-pay | Admitting: *Deleted

## 2023-07-09 NOTE — Progress Notes (Signed)
 Care Guide Pharmacy Note  07/09/2023 Name: [name] MRN: [id number] DOB: 29-Mar-1980  Referred By: Willo Mini, NP Reason for referral: Complex Care Management (Outreach to schedule referral with pharmacist )   Hunter Villarreal is a 43 y.o. year old male who is a primary care patient of Willo Mini, NP.  [name] was referred to the pharmacist for assistance related to: DMII  Successful contact was made with the patient to discuss pharmacy services including being ready for the pharmacist to call at least 5 minutes before the scheduled appointment time and to have medication bottles and any blood pressure readings ready for review. The patient agreed to meet with the pharmacist via telephone visit on 07/24/2023  Thedford Franks, CMA [place]  [facility], [facility] Guide Direct Dial: [phone]  Fax: [phone] Website: [email or web address]

## 2023-07-14 ENCOUNTER — Ambulatory Visit (INDEPENDENT_AMBULATORY_CARE_PROVIDER_SITE_OTHER): Admitting: Medical-Surgical

## 2023-07-14 ENCOUNTER — Encounter: Payer: Self-pay | Admitting: Medical-Surgical

## 2023-07-14 VITALS — BP 109/70 | HR 89 | Resp 20 | Ht 71.0 in | Wt 150.0 lb

## 2023-07-14 DIAGNOSIS — Z794 Long term (current) use of insulin: Secondary | ICD-10-CM | POA: Diagnosis not present

## 2023-07-14 DIAGNOSIS — E1159 Type 2 diabetes mellitus with other circulatory complications: Secondary | ICD-10-CM

## 2023-07-14 DIAGNOSIS — E782 Mixed hyperlipidemia: Secondary | ICD-10-CM | POA: Diagnosis not present

## 2023-07-14 DIAGNOSIS — I10 Essential (primary) hypertension: Secondary | ICD-10-CM | POA: Diagnosis not present

## 2023-07-14 NOTE — Progress Notes (Unsigned)
        Established patient visit  History, exam, impression, and plan:  1. Type 2 diabetes mellitus with other circulatory complication, with long-term current use of insulin  (HCC) (Primary) Very pleasant 43 year old male presenting today for follow up on diabetes. He was recently admitted to the hospital where he received a live donor kidney transplant. On discharge, he was continued on Lantus  24 units daily with Novolog  6 units TID AC and Novolog  SSI. Monitoring his glucose and notes that his readings are labile with most being higher than the 220 limit that was recommended. He has had some low readings to the 50s where he was symptomatic but these responded quickly to eating/drinking. He was on Mounjaro  but this was placed on hold to prevent issues with the new kidney. His A1c was checked 3 weeks ago with a reading of 9.3%. Discussed the need to continue the hold on Mounjaro . Would recommend he begin a titrate of Lantus  by 2 units up or down once every three days with a goal for fasting sugars between 90-120. Continue Novolog  TID AC and SSI. Make sure to work on dietary modifications to limit the intake of carbs and concentrated sweets. Continue to monitor glucose for hypoglycemic episodes. Remember to have snacks that contain protein to help stabilize sugars.   2. Essential hypertension Previously on Amlodipine, Valsartan , and hydrochlorothiazide for BP management. Since his transplant, his BP readings have been beautiful. He is currently not on any medications. BP is great today at 109/70. Cardiopulmonary exam normal. Denies concerning symptoms. Continue to monitor BP at home with a goal of 130/80 or less.   3. Mixed hyperlipidemia After transplant, was taken off atorvastatin . Recommend continuing to monitor lipids and work on a low fat heart healthy diet.   Procedures performed this visit: None.  Return in about 2 months (around 09/14/2023) for DM follow  up.  __________________________________ Zada FREDRIK Palin, DNP, APRN, FNP-BC Primary Care and Sports Medicine [facility] [place]

## 2023-07-15 ENCOUNTER — Other Ambulatory Visit: Payer: Self-pay

## 2023-07-15 ENCOUNTER — Encounter: Payer: Self-pay | Admitting: Medical-Surgical

## 2023-07-15 NOTE — Patient Instructions (Signed)
 Visit Information  Thank you for taking time to visit with me today. Please don't hesitate to contact me if I can be of assistance to you before our next scheduled telephone appointment.  Our next appointment is by telephone on 07/23/23 at 11am  Following is a copy of your care plan:   Goals Addressed             This Visit's Progress    VBCI Transitions of Care (TOC) Care Plan       Problems:  Recent Hospitalization for treatment of CKD Stage 4/ living-unrelated kidney transplant Knowledge Deficit Related to Kidney transplant  Goal:  Over the next 30 days, the patient will not experience hospital readmission  Interventions:   Patient confirms he is currently being seen by transplant team twice a week and confirms he had appointment with PCP yesterday as well. Patient states He is on sliding scale now due to recent increase in sugars - and states Lantus  dose adjusted as well  medications updated TOC RN and patient reviewed instructions as per record as follows: Call with temp > 100.5, nausea, vomiting, diarrhea, uncontrolled pain, incision issues (redness, drainage that is milky or foul smelling, or incision opening), decreased urine output or any issues you may have concerns about.  First clinic visit given on discharge instructions.  If you have a stent in place that is removed by urology at about 3 weeks and will be done in the urology dept.  Staples removed at 3-4 weeks. You are able to drive after that point if you are not taking pain pills. Drink around 2-3 liters of water a day and use the bathroom frequently, every 1.5-2 hours with frequent stops with car rides. Advised no lifting, pulling of any heavy weight above 10 pounds x 6 weeks Keep wound clean and dry, ok to shower. Do not submerge in bath. Plan for home care with self-monitoring of vital signs, urine output, drain output, weight and regular outpatient follow up, as scheduled. Please bring logbook to each clinic visit  with recorded blood pressure, pulse, weight, temp, urine output, intake amount (how much you drink each day), drain output and blood sugars if diabetic - 18 oz cup - drinking 6 a day - patient confirmed all    Chronic Kidney Disease Interventions: Evaluation of current treatment plan related to chronic kidney disease self management and patient's adherence to plan as established by provider      Reviewed medications with patient and discussed importance of compliance    Monitor vital signs, urine output, drain output, weight and regular outpatient follow up, as scheduled. Bring log book to each clinic visit with recorded blood pressure, pulse, weight, temp, urine output, intake amount (how much you drink each day), drain output and blood sugars if diabetic.  Last practice recorded BP readings:  BP Readings from Last 3 Encounters:  03/10/23 135/76  06/09/22 137/80  03/04/22 132/81   Most recent eGFR/CrCl:  Lab Results  Component Value Date   EGFR 13 09/01/2022    No components found for: CRCL  Patient Self Care Activities:  Call pharmacy for medication refills 3-7 days in advance of running out of medications Call provider office for new concerns or questions  Notify RN Care Manager of TOC call rescheduling needs Participate in Transition of Care Program/Attend TOC scheduled calls Perform all self care activities independently  Take medications as prescribed    Plan:  Telephone follow up appointment with care management team member scheduled for:07/23/23  11am        Patient verbalizes understanding of instructions and care plan provided today and agrees to view in MyChart. Active MyChart status and patient understanding of how to access instructions and care plan via MyChart confirmed with patient.     Telephone follow up appointment with care management team member scheduled for: 07/23/23 11am The patient has been provided with contact information for the care management team and  has been advised to call with any health related questions or concerns.   Please call the care guide team at [phone] if you need to cancel or reschedule your appointment.   Please call the Suicide and Crisis Lifeline: 988 call 1-800-273-TALK (toll free, 24 hour hotline) call 911 if you are experiencing a Mental Health or Behavioral Health Crisis or need someone to talk to.  Shona Prow RN, CCM [place]  VBCI-Population Health RN Care Manager [phone]

## 2023-07-15 NOTE — Transitions of Care (Post Inpatient/ED Visit) (Signed)
 Transition of Care week 2  Visit Note  07/15/2023  Name: Hunter Villarreal MRN: [id number]          DOB: [birth year]  Situation: Patient enrolled in [facility] 30-day program. Visit completed with patient by telephone.   Background:   Initial Transition Care Management Follow-up Telephone Call    Past Medical History:  Diagnosis Date   Diabetes mellitus without complication (HCC)    History of shingles    Hypertension     Assessment: Patient Reported Symptoms: Cognitive Cognitive Status: Alert and oriented to person, place, and time, Normal speech and language skills      Neurological Neurological Review of Symptoms: No symptoms reported    HEENT        Cardiovascular Cardiovascular Symptoms Reported: No symptoms reported Weight: 145 lb 14.4 oz (66.2 kg) Cardiovascular Comment: Patient reports the 150lb weight was at MD appt where he was clothes, wearing shoes - 145.9 at home  Respiratory Respiratory Symptoms Reported: No symptoms reported    Endocrine Endocrine Symptoms Reported: No symptoms reported Is patient diabetic?: Yes Is patient checking blood sugars at home?: Yes List most recent blood sugar readings, include date and time of day: Via Dexcom patient reports today's hight 292 and low was 99 - has improved  from previous readings    Gastrointestinal Gastrointestinal Symptoms Reported: Diarrhea Additional Gastrointestinal Details: Patient reports he had some diarrhea the other day and states he thinks it was something he ate and states this has resolved completely      Genitourinary Genitourinary Symptoms Reported: No symptoms reported    Integumentary Integumentary Symptoms Reported: Incision Additional Integumentary Details: staples removed in another 3 weeks - patient denies any signs/symptoms of infection    Musculoskeletal          Psychosocial Psychosocial Symptoms Reported: No symptoms reported         There were no vitals filed for this  visit.  Medications Reviewed Today     Reviewed by Lauro Shona LABOR, RN (Registered Nurse) on 07/15/23 at 1604  Med List Status: <None>   Medication Order Taking? Sig Documenting Provider Last Dose Status Informant  amLODipine (NORVASC) 10 MG tablet [id number]  Take 10 mg by mouth daily.  Patient not taking: Reported on 07/15/2023   [provider]  Active   aspirin EC 81 MG tablet [id number] Yes Take 81 mg by mouth daily. Swallow whole. [provider]  Active   atorvastatin  (LIPITOR) 40 MG tablet [id number]  Take 1 tablet (40 mg total) by mouth daily.  Patient not taking: Reported on 07/15/2023   Willo Mini, NP  Active   blood glucose meter kit and supplies KIT [id number] Yes Dispense based on patient and insurance preference. Use up to four times daily as directed. Please include lancets, test strips, control solution. Willo Mini, NP  Active   Continuous Blood Gluc Sensor (FREESTYLE LIBRE 3 SENSOR) [place] [id number]  Place 1 sensor on the skin every 14 days. Use to check glucose continuously  Patient not taking: Reported on 07/15/2023   Willo Mini, NP  Active   Continuous Glucose Receiver [facility] G7 RECEIVER) DEVI [id number] Yes by Does not apply route. [provider]  Active   Continuous Glucose Receiver [facility] G7 RECEIVER) DEVI [id number] Yes by Does not apply route. [provider]  Active   Continuous Glucose Sensor (DEXCOM G7 SENSOR) MISC [id number] Yes by Does not apply route. [provider]  Active   famotidine (PEPCID) 20 MG tablet [id number]  Yes Take 20 mg by mouth at bedtime. [provider]  Active   fluconazole (DIFLUCAN) 50 MG tablet [id number] Yes Take 50 mg by mouth daily. [provider]  Active   glucose blood (ONETOUCH VERIO) test strip [id number] Yes 1 each by Other route 2 (two) times daily. And lancets 2/day Kassie Mallick, MD  Active   hydrochlorothiazide (HYDRODIURIL) 12.5 MG tablet [id number]  Take 12.5 mg by mouth  daily.  Patient not taking: Reported on 07/15/2023   [provider]  Active   insulin  aspart (NOVOLOG ) 100 UNIT/ML FlexPen [id number] Yes Inject 6 Units into the skin 3 (three) times daily with meals.  Patient taking differently: Inject 6 Units into the skin 3 (three) times daily with meals. Patient reports he is now on a sliding scale by transplant team: 6 units 3x/day 150 - 200 add 1 unit to the 6 units  200-250 add 2 units to the 6 units 250-300 add 3 units to the 6 units 300 - 350 add 4 units to the 6 units  350 - 400 add 5 units to the 6 units and if stays at 400 for an hour Call Transplant team   [provider]  Active   Insulin  Pen Needle (BD PEN NEEDLE NANO 2ND GEN) 32G X 4 MM MISC [id number] Yes by Does not apply route. [provider]  Active   LANTUS  SOLOSTAR 100 UNIT/ML Solostar Pen [id number] Yes INJECT 20 UNITS SUBCUTANEOUSLY ONCE DAILY  Patient taking differently: 24 Units daily. Patient states he is now at 24 units per surgical team 07/09/23 and states 7/8 PCP said if sugar is still high increase by 1-2 units and if goes down to decrease dosage   Willo Mini, NP  Active   mycophenolate (MYFORTIC) 180 MG EC tablet [id number] Yes Take 180 mg by mouth 2 (two) times daily. [provider]  Active   polyethylene glycol (MIRALAX / GLYCOLAX) 17 g packet [id number] Yes Take 17 g by mouth daily as needed for mild constipation or moderate constipation. [provider]  Active   predniSONE (DELTASONE) 5 MG tablet [id number] Yes Take 20 mg by mouth daily with breakfast.  Patient taking differently: Take 5 mg by mouth daily with breakfast. As of call 07/15/23 patient reports currently at 15mg  daily, and will decrease to 10mg  07/22/23 and then will decrease to 5mg    [provider]  Active   senna-docusate (SENOKOT-S) 8.6-50 MG tablet [id number] Yes Take 1 tablet by mouth 2 (two) times daily as needed for mild constipation. [provider]   Active   sulfamethoxazole -trimethoprim  (BACTRIM ) 400-80 MG tablet [id number] Yes Take 1 tablet by mouth 3 (three) times a week. [provider]  Active            Med Note LORICE CATHLEAN VEAR Pablo Jul 06, 2023  4:22 PM) Take 1 tablet by mouth every Monday, Wednesday and Friday  tacrolimus (PROGRAF) 5 MG capsule [id number] Yes Take 10 mg by mouth 2 (two) times daily. [provider]  Active   tirzepatide  (MOUNJARO ) 5 MG/0.5ML Pen [id number]  Inject 5 mg into the skin once a week.  Patient not taking: Reported on 07/15/2023   Willo Mini, NP  Active            Med Note LORICE CATHLEAN VEAR Pablo Jul 06, 2023  4:24 PM) Wait to take this until your doctor or other care provider tells you to start again  valGANciclovir (VALCYTE) 450  MG tablet [id number] Yes Take 450 mg by mouth daily. [provider]  Active   valsartan  (DIOVAN ) 160 MG tablet [id number]  Take 1 tablet (160 mg total) by mouth daily.  Patient not taking: Reported on 07/15/2023   Willo Mini, NP  Active             Recommendation:   Continue Current Plan of Care  Follow Up Plan:   Telephone follow up appointment date/time:  07/23/23 11am  Shona Prow RN, CCM [place]  VBCI-Population Health RN Care Manager [phone]

## 2023-07-19 ENCOUNTER — Other Ambulatory Visit: Payer: Self-pay | Admitting: Medical-Surgical

## 2023-07-23 ENCOUNTER — Other Ambulatory Visit: Payer: Self-pay

## 2023-07-23 NOTE — Transitions of Care (Post Inpatient/ED Visit) (Signed)
 Transition of Care week 3  Visit Note  07/23/2023  Name: Hunter Villarreal MRN: [id number]          DOB: July 07, 1980  Situation: Patient enrolled in [facility] 30-day program. Visit completed with patient and wife by telephone.   Background:   Initial Transition Care Management Follow-up Telephone Call    Past Medical History:  Diagnosis Date   Diabetes mellitus without complication (HCC)    ESRD (end stage renal disease) on dialysis (HCC) 03/10/2023   History of shingles    Hypertension     Assessment: Patient Reported Symptoms: Cognitive Cognitive Status: No symptoms reported, Alert and oriented to person, place, and time, Normal speech and language skills      Neurological Neurological Review of Symptoms: No symptoms reported    HEENT HEENT Symptoms Reported: No symptoms reported      Cardiovascular Cardiovascular Symptoms Reported: No symptoms reported Weight: 149 lb (67.6 kg)  Respiratory Respiratory Symptoms Reported: No symptoms reported    Endocrine Endocrine Symptoms Reported: No symptoms reported Is patient diabetic?: Yes Is patient checking blood sugars at home?: Yes List most recent blood sugar readings, include date and time of day: Patient reports am reading 156 ate took insulin  and was 91 then down to 72 - ate an oatmeal cookie and sugar is coming back up currently 87 via Dexcom - Patient states set to alarm under 80 and over 250 - education provided on importance of notifying transplant team as he is weaning off Prednisone Endocrine Self-Management Outcome: 4 (good)  Gastrointestinal Gastrointestinal Symptoms Reported: No symptoms reported Additional Gastrointestinal Details: Patient reports diarrhea has resolved      Genitourinary Genitourinary Symptoms Reported: No symptoms reported    Integumentary Integumentary Symptoms Reported: No symptoms reported Additional Integumentary Details: patient reports staples were removed - plan for stent removal week of 7/21     Musculoskeletal          Psychosocial           Vitals:   07/23/23 1213  BP: 114/77  Pulse: 95    Medications Reviewed Today     Reviewed by Lauro Shona LABOR, RN (Registered Nurse) on 07/23/23 at 1212  Med List Status: <None>   Medication Order Taking? Sig Documenting Provider Last Dose Status Informant  amLODipine (NORVASC) 10 MG tablet [id number]  Take 10 mg by mouth daily.  Patient not taking: Reported on 07/23/2023   [provider]  Active   aspirin EC 81 MG tablet [id number] Yes Take 81 mg by mouth daily. Swallow whole. [provider]  Active   atorvastatin  (LIPITOR) 40 MG tablet [id number]  Take 1 tablet (40 mg total) by mouth daily.  Patient not taking: Reported on 07/23/2023   Willo Mini, NP  Active   blood glucose meter kit and supplies KIT [id number] Yes Dispense based on patient and insurance preference. Use up to four times daily as directed. Please include lancets, test strips, control solution. Willo Mini, NP  Active   Continuous Blood Gluc Sensor (FREESTYLE LIBRE 3 SENSOR) [place] [id number]  Place 1 sensor on the skin every 14 days. Use to check glucose continuously  Patient not taking: Reported on 07/23/2023   Willo Mini, NP  Active   Continuous Glucose Receiver [facility] G7 RECEIVER) DEVI [id number] Yes by Does not apply route. [provider]  Active   Continuous Glucose Receiver [facility] G7 RECEIVER) DEVI [id number] Yes by Does not apply route. [provider]  Active   Continuous Glucose Sensor (  DEXCOM G7 SENSOR) MISC [id number] Yes by Does not apply route. [provider]  Active   famotidine (PEPCID) 20 MG tablet [id number] Yes Take 20 mg by mouth at bedtime. [provider]  Active   fluconazole (DIFLUCAN) 50 MG tablet [id number] Yes Take 50 mg by mouth daily. [provider]  Active   glucose blood (ONETOUCH VERIO) test strip [id number] Yes 1 each by Other route 2 (two) times daily. And lancets 2/day  Kassie Mallick, MD  Active   hydrochlorothiazide (HYDRODIURIL) 12.5 MG tablet [id number]  Take 12.5 mg by mouth daily.  Patient not taking: Reported on 07/23/2023   [provider]  Active   insulin  aspart (NOVOLOG ) 100 UNIT/ML FlexPen [id number] Yes Inject 6 Units into the skin 3 (three) times daily with meals. [provider]  Active   Insulin  Pen Needle (BD PEN NEEDLE NANO 2ND GEN) 32G X 4 MM MISC [id number] Yes by Does not apply route. [provider]  Active   LANTUS  SOLOSTAR 100 UNIT/ML Solostar Pen [id number] Yes INJECT 20 UNITS SUBCUTANEOUSLY ONCE DAILY Jessup, Joy, NP  Active   magnesium oxide (MAG-OX) 400 (240 Mg) MG tablet [id number] Yes Take 400 mg by mouth daily as needed (as directed by transplant team). [provider]  Active   mycophenolate (MYFORTIC) 180 MG EC tablet [id number] Yes Take 180 mg by mouth 2 (two) times daily. [provider]  Active   polyethylene glycol (MIRALAX / GLYCOLAX) 17 g packet [id number] Yes Take 17 g by mouth daily as needed for mild constipation or moderate constipation. [provider]  Active   predniSONE (DELTASONE) 5 MG tablet [id number] Yes Take 20 mg by mouth daily with breakfast. [provider]  Active   senna-docusate (SENOKOT-S) 8.6-50 MG tablet [id number] Yes Take 1 tablet by mouth 2 (two) times daily as needed for mild constipation. [provider]  Active   sodium zirconium cyclosilicate (LOKELMA) 10 g PACK packet [id number] Yes Take 10 g by mouth daily as needed (as directed by transplant team). [provider]  Active   sulfamethoxazole -trimethoprim  (BACTRIM ) 400-80 MG tablet [id number] Yes Take 1 tablet by mouth 3 (three) times a week. [provider]  Active            Med Note LORICE CATHLEAN VEAR Pablo Jul 06, 2023  4:22 PM) Take 1 tablet by mouth every Monday, Wednesday and Friday  tacrolimus (PROGRAF) 5 MG capsule [id number] Yes Take 10 mg by mouth 2 (two) times  daily. [provider]  Active   tirzepatide  (MOUNJARO ) 5 MG/0.5ML Pen [id number]  Inject 5 mg into the skin once a week.  Patient not taking: Reported on 07/23/2023   Willo Mini, NP  Active            Med Note LORICE CATHLEAN VEAR Pablo Jul 06, 2023  4:24 PM) Wait to take this until your doctor or other care provider tells you to start again  valGANciclovir (VALCYTE) 450 MG tablet [id number] Yes Take 450 mg by mouth daily. [provider]  Active   valsartan  (DIOVAN ) 160 MG tablet [id number]  Take 1 tablet (160 mg total) by mouth daily.  Patient not taking: Reported on 07/23/2023   Willo Mini, NP  Active             Recommendation:   Continue Current Plan of Care  Follow Up Plan:   Telephone follow up appointment date/time:  07/30/23 3pm   Shona Prow RN,  CCM [place]  VBCI-Population Health RN Care Manager [phone]

## 2023-07-24 ENCOUNTER — Other Ambulatory Visit

## 2023-07-24 NOTE — Progress Notes (Signed)
 07/24/2023 Name: Hunter Villarreal MRN: [id number] DOB: Feb 16, 1980  Chief Complaint  Patient presents with   Diabetes Management Plan   Hunter Villarreal is a 43 y.o. year old male who presented for a telephone visit.   They were referred to the pharmacist by their PCP for assistance in managing diabetes.   Subjective:  Care Team: Primary Care Provider: Willo Mini, NP ; Next Scheduled Visit: 9/8  Medication Access/Adherence  Current Pharmacy:  [facility] [address], [place] - [address] [place] [address] Phone: [phone] Fax: [phone]  -Patient reports affordability concerns with their medications: No  -Patient reports access/transportation concerns to their pharmacy: No  -Patient reports adherence concerns with their medications:  No    Diabetes: Current medications: Lantus  26 units in the morning, Novolog  6 units + SSI TID before meals -Currently holding Mounjaro  as directed by transplant team -Using Dexcom G7 sensors with mobile app for CGM.  Had trouble with 1st sensor and G7 reader, but second and third (current) sensor are working well along with Dexcom mobile app. -Current glucose readings: vary- patient states FBG can be 158, then BG will drop to 72 around 10am, but go back up to 300 by 3-4pm -Patient states BG has dropped as low as 49 before, and he does not have any physical symptoms of hypoglycemia -Usually works 3rd shift but is not currently, so meal patterns have differed from his norm- not currently having breakfast regularly  Objective:  Lab Results  Component Value Date   HGBA1C 8.5 (A) 04/14/2023   Lab Results  Component Value Date   CREATININE 3.72 (H) 08/02/2021   BUN 36 (H) 08/02/2021   NA 140 08/02/2021   K 5.7 (H) 08/02/2021   CL 109 08/02/2021   CO2 24 08/02/2021   Medications Reviewed Today     Reviewed by Deanna Channing LABOR, RPH (Pharmacist) on 07/24/23 at 1542  Med List Status: <None>    Medication Order Taking? Sig Documenting Provider Last Dose Status Informant  amLODipine (NORVASC) 10 MG tablet [id number]  Take 10 mg by mouth daily.  Patient not taking: Reported on 07/23/2023   [provider]  Active   aspirin EC 81 MG tablet [id number]  Take 81 mg by mouth daily. Swallow whole. [provider]  Active   atorvastatin  (LIPITOR) 40 MG tablet [id number]  Take 1 tablet (40 mg total) by mouth daily.  Patient not taking: Reported on 07/23/2023   Willo Mini, NP  Active   blood glucose meter kit and supplies KIT [id number]  Dispense based on patient and insurance preference. Use up to four times daily as directed. Please include lancets, test strips, control solution. Willo Mini, NP  Active   Continuous Blood Gluc Sensor (FREESTYLE LIBRE 3 SENSOR) [place] [id number] Yes Place 1 sensor on the skin every 14 days. Use to check glucose continuously Willo Mini, NP  Active     Discontinued 07/24/23 1533     Discontinued 07/24/23 1533     Discontinued 07/24/23 1533   famotidine (PEPCID) 20 MG tablet [id number]  Take 20 mg by mouth at bedtime. [provider]  Active   fluconazole (DIFLUCAN) 50 MG tablet [id number]  Take 50 mg by mouth daily. [provider]  Active   glucose blood (ONETOUCH VERIO) test strip [id number]  1 each by Other route 2 (two) times daily. And lancets 2/day Kassie Mallick, MD  Active   hydrochlorothiazide (HYDRODIURIL) 12.5 MG tablet [id number]  Take 12.5 mg by mouth daily.  Patient not taking: Reported on 07/23/2023   [provider]  Active   insulin  aspart (NOVOLOG ) 100 UNIT/ML FlexPen [id number] Yes Inject 6 Units into the skin 3 (three) times daily with meals. [provider]  Active   Insulin  Pen Needle (BD PEN NEEDLE NANO 2ND GEN) 32G X 4 MM MISC [id number]  by Does not apply route. [provider]  Active   LANTUS  SOLOSTAR 100 UNIT/ML Solostar Pen [id number] Yes INJECT 20 UNITS SUBCUTANEOUSLY ONCE DAILY   Patient taking differently: Inject 26 Units into the skin in the morning.   Willo Mini, NP  Active   magnesium oxide (MAG-OX) 400 (240 Mg) MG tablet [id number]  Take 400 mg by mouth daily as needed (as directed by transplant team). [provider]  Active   mycophenolate (MYFORTIC) 180 MG EC tablet [id number]  Take 180 mg by mouth 2 (two) times daily. [provider]  Active   polyethylene glycol (MIRALAX / GLYCOLAX) 17 g packet [id number]  Take 17 g by mouth daily as needed for mild constipation or moderate constipation. [provider]  Active   predniSONE (DELTASONE) 5 MG tablet [id number]  Take 20 mg by mouth daily with breakfast. [provider]  Active   senna-docusate (SENOKOT-S) 8.6-50 MG tablet [id number]  Take 1 tablet by mouth 2 (two) times daily as needed for mild constipation. [provider]  Active   sodium zirconium cyclosilicate (LOKELMA) 10 g PACK packet [id number]  Take 10 g by mouth daily as needed (as directed by transplant team). [provider]  Active   sulfamethoxazole -trimethoprim  (BACTRIM ) 400-80 MG tablet [id number]  Take 1 tablet by mouth 3 (three) times a week. [provider]  Active            Med Note LORICE CATHLEAN VEAR Pablo Jul 06, 2023  4:22 PM) Take 1 tablet by mouth every Monday, Wednesday and Friday  tacrolimus (PROGRAF) 5 MG capsule [id number]  Take 10 mg by mouth 2 (two) times daily. [provider]  Active   tirzepatide  (MOUNJARO ) 5 MG/0.5ML Pen [id number]  Inject 5 mg into the skin once a week.  Patient not taking: Reported on 07/24/2023   Willo Mini, NP  Active            Med Note LORICE CATHLEAN VEAR Pablo Jul 06, 2023  4:24 PM) Wait to take this until your doctor or other care provider tells you to start again  valGANciclovir (VALCYTE) 450 MG tablet [id number]  Take 450 mg by mouth daily. [provider]  Active   valsartan  (DIOVAN ) 160 MG tablet [id number]  Take 1 tablet (160 mg  total) by mouth daily.  Patient not taking: Reported on 07/23/2023   Willo Mini, NP  Active            Assessment/Plan:   Diabetes: -Currently uncontrolled -Continue current regimen at this time -Continue Dexcom G7 for CGM -Resume Mounjaro  when directed to by transplant team- patient has next follow-up Wednesday   Follow Up Plan: 4 weeks, but patient will reach out if told to resume Mounjaro  or if any issues with Dexcom arise  [name], PharmD, DPLA

## 2023-07-29 ENCOUNTER — Encounter: Payer: Self-pay | Admitting: Medical-Surgical

## 2023-07-30 ENCOUNTER — Other Ambulatory Visit: Payer: Self-pay

## 2023-07-30 NOTE — Patient Instructions (Signed)
 Visit Information  Thank you for taking time to visit with me today. Please don't hesitate to contact me if I can be of assistance to you before our next scheduled telephone appointment.  Our next appointment is by telephone on 08/07/23 at 3pm  Following is a copy of your care plan:   Goals Addressed             This Visit's Progress    VBCI Transitions of Care (TOC) Care Plan       Problems:  Recent Hospitalization for treatment of CKD Stage 4/ living-unrelated kidney transplant Knowledge Deficit Related to Kidney transplant  Goal:  Over the next 30 days, the patient will not experience hospital readmission  Interventions:   Patient confirms he is currently being seen by transplant team twice a week and confirms he had appointment with PCP yesterday as well. Patient states He is on sliding scale now due to recent increase in sugars - and states Lantus  dose adjusted as well  medications updated - Update 07/30/23: Patient is being seen now once a week but does have endocrinology appointment tomorrow to discuss better managing his sugar - reports today is 310 TOC RN and patient reviewed instructions as per record as follows: Call with temp > 100.5, nausea, vomiting, diarrhea, uncontrolled pain, incision issues (redness, drainage that is milky or foul smelling, or incision opening), decreased urine output or any issues you may have concerns about.  First clinic visit given on discharge instructions.  If you have a stent in place that is removed by urology at about 3 weeks and will be done in the urology dept. 07/23/23 Update: patient with appt week of 7/21 for stent removal Staples removed at 3-4 weeks. You are able to drive after that point if you are not taking pain pills. 07/23/23 Update: Patient confirmed staples were removed Drink around 2-3 liters of water a day and use the bathroom frequently, every 1.5-2 hours with frequent stops with car rides. 07/23/23 Update: Patient confirms he has  increased his fluid intake after having to receive IV fluids at appointment Advised no lifting, pulling of any heavy weight above 10 pounds x 6 weeks Keep wound clean and dry, ok to shower. Do not submerge in bath. Plan for home care with self-monitoring of vital signs, urine output, drain output, weight and regular outpatient follow up, as scheduled. Please bring logbook to each clinic visit with recorded blood pressure, pulse, weight, temp, urine output, intake amount (how much you drink each day), drain output and blood sugars if diabetic - 18 oz cup - drinking 6 a day - patient confirmed all  07/23/23 Update: Reviewed with patient who reports weight 149lbs BP 114/77 sitting  HR 95 bgm sugar 156 this am, states he ate,  took SS insulin  as prescribed (6 units and for sugar of 150 - 200 add 1 unit to the 6 units) rechecked 91 down to 72 - ate again 87 - Patient/wife state Dexcom set to alarm under 80 and above 250 - TOC RN educated that sugar may continue dropping as he continues to decrease Prednisone and to make sure transplant team is aware of his low or high readings/patient confirmed that his wife knows how to check his sugar and respond appropriately to readings.  Wife states patient was also prescribed the followings: Lokelma 10g once daily prn  & Mag oxide 400mg  once daily prn and states another new med was sent to pharmacy for low BP but unsure what it  is as they have not yet picked it up Patient states he is feeling well today and will have ultrasound 7/18, stent removal week of 07/27/23 Update 07/30/23: Patient reports he is doing well except sugars remain up and down and reports 310 via Dexcom during call, patient also reports BP 115/77 weight 156.9 tempt 97.5 - patient reports the new medication reported last week is Midodrine 5mg  2x/day for BP <105/70   Chronic Kidney Disease Interventions: Evaluation of current treatment plan related to chronic kidney disease self management and patient's  adherence to plan as established by provider      Reviewed medications with patient and discussed importance of compliance    Monitor vital signs, urine output, drain output, weight and regular outpatient follow up, as scheduled. Bring log book to each clinic visit with recorded blood pressure, pulse, weight, temp, urine output, intake amount (how much you drink each day), drain output and blood sugars if diabetic.  Last practice recorded BP readings:  BP Readings from Last 3 Encounters:  03/10/23 135/76  06/09/22 137/80  03/04/22 132/81   Most recent eGFR/CrCl:  Lab Results  Component Value Date   EGFR 13 09/01/2022    No components found for: CRCL  Patient Self Care Activities:  Call pharmacy for medication refills 3-7 days in advance of running out of medications Call provider office for new concerns or questions  Notify RN Care Manager of TOC call rescheduling needs Participate in Transition of Care Program/Attend TOC scheduled calls Perform all self care activities independently  Take medications as prescribed   Contact transplant team for abnormal sugar readings   Plan:  Telephone follow up appointment with care management team member scheduled for: 08/07/23 3pm        Patient verbalizes understanding of instructions and care plan provided today and agrees to view in MyChart. Active MyChart status and patient understanding of how to access instructions and care plan via MyChart confirmed with patient.     Telephone follow up appointment with care management team member scheduled for: 08/07/23 3pm The patient has been provided with contact information for the care management team and has been advised to call with any health related questions or concerns.   Please call the care guide team at [phone] if you need to cancel or reschedule your appointment.   Please call the Suicide and Crisis Lifeline: 988 call 1-800-273-TALK (toll free, 24 hour hotline) call 911 if you  are experiencing a Mental Health or Behavioral Health Crisis or need someone to talk to.  Shona Prow RN, CCM [place]  VBCI-Population Health RN Care Manager [phone]

## 2023-07-30 NOTE — Transitions of Care (Post Inpatient/ED Visit) (Signed)
 Transition of Care week 4  Visit Note  07/30/2023  Name: Hunter Villarreal MRN: [id number]          DOB: 22-May-1980  Situation: Patient enrolled in [facility] 30-day program. Visit completed with patient by telephone.   Background:   Initial Transition Care Management Follow-up Telephone Call    Past Medical History:  Diagnosis Date   Diabetes mellitus without complication (HCC)    ESRD (end stage renal disease) on dialysis (HCC) 03/10/2023   History of shingles    Hypertension     Assessment: Patient Reported Symptoms: Cognitive Cognitive Status: No symptoms reported, Alert and oriented to person, place, and time, Normal speech and language skills      Neurological Neurological Review of Symptoms: Not assessed    HEENT HEENT Symptoms Reported: Not assessed      Cardiovascular Cardiovascular Symptoms Reported: No symptoms reported Weight: 156 lb 14.4 oz (71.2 kg) Cardiovascular Comment: patient reports he is monitoring his weight and understands when to call transplant team  Respiratory Respiratory Symptoms Reported: No symptoms reported    Endocrine Endocrine Symptoms Reported: No symptoms reported List most recent blood sugar readings, include date and time of day: patient states he has appointment 07/31/23 with endocrinology to discuss options to better manage his sugar - reports 310 during call    Gastrointestinal Gastrointestinal Symptoms Reported: Not assessed      Genitourinary Genitourinary Symptoms Reported: No symptoms reported    Integumentary Integumentary Symptoms Reported: Not assessed    Musculoskeletal          Psychosocial           Vitals:   07/30/23 1544  BP: 115/70  Temp: (!) 97.5 F (36.4 C)    Medications Reviewed Today     Reviewed by Lauro Shona LABOR, RN (Registered Nurse) on 07/30/23 at 1537  Med List Status: <None>   Medication Order Taking? Sig Documenting Provider Last Dose Status Informant  amLODipine (NORVASC) 10 MG tablet  [id number]  Take 10 mg by mouth daily.  Patient not taking: Reported on 07/30/2023   [provider]  Active   aspirin EC 81 MG tablet [id number] Yes Take 81 mg by mouth daily. Swallow whole. [provider]  Active   atorvastatin  (LIPITOR) 40 MG tablet [id number]  Take 1 tablet (40 mg total) by mouth daily.  Patient not taking: Reported on 07/30/2023   Willo Mini, NP  Active   blood glucose meter kit and supplies KIT [id number] Yes Dispense based on patient and insurance preference. Use up to four times daily as directed. Please include lancets, test strips, control solution. Willo Mini, NP  Active   Continuous Blood Gluc Sensor (FREESTYLE LIBRE 3 SENSOR) [place] [id number] Yes Place 1 sensor on the skin every 14 days. Use to check glucose continuously Willo Mini, NP  Active   famotidine (PEPCID) 20 MG tablet [id number] Yes Take 20 mg by mouth at bedtime. [provider]  Active   fluconazole (DIFLUCAN) 50 MG tablet [id number] Yes Take 50 mg by mouth daily. [provider]  Active   glucose blood (ONETOUCH VERIO) test strip [id number] Yes 1 each by Other route 2 (two) times daily. And lancets 2/day Kassie Mallick, MD  Active   hydrochlorothiazide (HYDRODIURIL) 12.5 MG tablet [id number]  Take 12.5 mg by mouth daily.  Patient not taking: Reported on 07/30/2023   [provider]  Active   insulin  aspart (NOVOLOG ) 100 UNIT/ML FlexPen [id number] Yes Inject 6 Units into the skin 3 (  three) times daily with meals. [provider]  Active   Insulin  Pen Needle (BD PEN NEEDLE NANO 2ND GEN) 32G X 4 MM MISC [id number] Yes by Does not apply route. [provider]  Active   LANTUS  SOLOSTAR 100 UNIT/ML Solostar Pen [id number] Yes INJECT 20 UNITS SUBCUTANEOUSLY ONCE DAILY Jessup, Joy, NP  Active   magnesium oxide (MAG-OX) 400 (240 Mg) MG tablet [id number] Yes Take 400 mg by mouth daily as needed (as directed by transplant team). [provider]  Active    midodrine (PROAMATINE) 5 MG tablet [id number] Yes Take 5 mg by mouth 2 (two) times daily as needed (for BP < 105/70). [provider]  Active   mycophenolate (MYFORTIC) 180 MG EC tablet [id number] Yes Take 180 mg by mouth 2 (two) times daily. [provider]  Active   polyethylene glycol (MIRALAX / GLYCOLAX) 17 g packet [id number] Yes Take 17 g by mouth daily as needed for mild constipation or moderate constipation. [provider]  Active   predniSONE (DELTASONE) 5 MG tablet [id number] Yes Take 20 mg by mouth daily with breakfast.  Patient taking differently: Take 20 mg by mouth daily with breakfast. Patient now taking 5mg  once daily   [provider]  Active   senna-docusate (SENOKOT-S) 8.6-50 MG tablet [id number] Yes Take 1 tablet by mouth 2 (two) times daily as needed for mild constipation. [provider]  Active   sodium zirconium cyclosilicate (LOKELMA) 10 g PACK packet [id number] Yes Take 10 g by mouth daily as needed (as directed by transplant team). [provider]  Active   sulfamethoxazole -trimethoprim  (BACTRIM ) 400-80 MG tablet [id number] Yes Take 1 tablet by mouth 3 (three) times a week. [provider]  Active            Med Note LORICE CATHLEAN VEAR Pablo Jul 06, 2023  4:22 PM) Take 1 tablet by mouth every Monday, Wednesday and Friday  tacrolimus (PROGRAF) 5 MG capsule [id number] Yes Take 10 mg by mouth 2 (two) times daily. [provider]  Active   tirzepatide  (MOUNJARO ) 5 MG/0.5ML Pen [id number]  Inject 5 mg into the skin once a week.  Patient not taking: Reported on 07/30/2023   Willo Mini, NP  Active            Med Note LORICE CATHLEAN VEAR Pablo Jul 06, 2023  4:24 PM) Wait to take this until your doctor or other care provider tells you to start again  valGANciclovir (VALCYTE) 450 MG tablet [id number] Yes Take 450 mg by mouth daily. [provider]  Active   valsartan  (DIOVAN ) 160 MG tablet [id number]  Take  1 tablet (160 mg total) by mouth daily.  Patient not taking: Reported on 07/30/2023   Willo Mini, NP  Active             Recommendation:   Continue Current Plan of Care  Follow Up Plan:   Telephone follow up appointment date/time:  08/07/23 3pm  Shona Prow RN, CCM [place]  VBCI-Population Health RN Care Manager [phone]

## 2023-08-07 ENCOUNTER — Other Ambulatory Visit: Payer: Self-pay

## 2023-08-07 DIAGNOSIS — Z940 Kidney transplant status: Secondary | ICD-10-CM

## 2023-08-07 NOTE — Transitions of Care (Post Inpatient/ED Visit) (Signed)
 Transition of Care Week #5  Visit Note  08/07/2023  Name: Hunter Villarreal MRN: [id number]          DOB: 03-28-1980  Situation: Patient enrolled in [facility] 30-day program. Visit completed with patient and wife by telephone.   Background: Admit/Discharge Date  6/26 -6/29  [facility]   Primary Diagnosis: ESRD (end stage renal disease) Living-donor kidney transplant recipient   Initial Transition Care Management Follow-up Telephone Call    Past Medical History:  Diagnosis Date   Diabetes mellitus without complication (HCC)    ESRD (end stage renal disease) on dialysis (HCC) 03/10/2023   History of shingles    Hypertension     Assessment:No assessments today - patient and wife in car from MD visit - Call kept short - patient agreed to [facility] referral  Patient reports BP today 111/76 weight 159.6   Medications Reviewed Today     Reviewed by Lauro Shona LABOR, RN (Registered Nurse) on 08/07/23 at 1520  Med List Status: <None>   Medication Order Taking? Sig Documenting Provider Last Dose Status Informant  amLODipine (NORVASC) 10 MG tablet [id number]  Take 10 mg by mouth daily.  Patient not taking: Reported on 08/07/2023   [provider]  Active   aspirin EC 81 MG tablet [id number]  Take 81 mg by mouth daily. Swallow whole. - patient states he was told 08/07/23 by transplant team he no longer needs to take this [provider]  Consider Medication Status and Discontinue (No longer needed (for PRN medications))   atorvastatin  (LIPITOR) 40 MG tablet [id number]  Take 1 tablet (40 mg total) by mouth daily.  Patient not taking: Reported on 08/07/2023   Willo Mini, NP  Active   blood glucose meter kit and supplies KIT [id number] Yes Dispense based on patient and insurance preference. Use up to four times daily as directed. Please include lancets, test strips, control solution. Willo Mini, NP  Active   Continuous Blood Gluc Sensor (FREESTYLE LIBRE 3 SENSOR) [place]  [id number] Yes Place 1 sensor on the skin every 14 days. Use to check glucose continuously Willo Mini, NP  Active   famotidine (PEPCID) 20 MG tablet [id number] Yes Take 20 mg by mouth at bedtime. [provider]  Active   fluconazole (DIFLUCAN) 50 MG tablet [id number] Yes Take 50 mg by mouth daily. [provider]  Active   glucose blood (ONETOUCH VERIO) test strip [id number] Yes 1 each by Other route 2 (two) times daily. And lancets 2/day Kassie Mallick, MD  Active   hydrochlorothiazide (HYDRODIURIL) 12.5 MG tablet [id number]  Take 12.5 mg by mouth daily.  Patient not taking: Reported on 08/07/2023   [provider]  Active   insulin  aspart (NOVOLOG ) 100 UNIT/ML FlexPen [id number] Yes Inject 6 Units into the skin 3 (three) times daily with meals. [provider]  Active   Insulin  Pen Needle (BD PEN NEEDLE NANO 2ND GEN) 32G X 4 MM MISC [id number] Yes by Does not apply route. [provider]  Active   LANTUS  SOLOSTAR 100 UNIT/ML Solostar Pen [id number] Yes INJECT 20 UNITS SUBCUTANEOUSLY ONCE DAILY  Patient taking differently: 30 Units daily.   Willo Mini, NP  Active   magnesium oxide (MAG-OX) 400 (240 Mg) MG tablet [id number] Yes Take 400 mg by mouth daily as needed (as directed by transplant team). [provider]  Active   midodrine (PROAMATINE) 5 MG tablet [id number] Yes Take 5 mg by mouth 2 (two) times daily as  needed (for BP < 105/70). [provider]  Active   mycophenolate (MYFORTIC) 180 MG EC tablet [id number] Yes Take 180 mg by mouth 2 (two) times daily. [provider]  Active   polyethylene glycol (MIRALAX / GLYCOLAX) 17 g packet [id number]  Take 17 g by mouth daily as needed for mild constipation or moderate constipation.  Patient not taking: Reported on 08/07/2023   [provider]  Active   predniSONE (DELTASONE) 5 MG tablet [id number] Yes Take 20 mg by mouth daily with breakfast. Taking 5mg  daily [provider]   Active   senna-docusate (SENOKOT-S) 8.6-50 MG tablet [id number] Yes Take 1 tablet by mouth 2 (two) times daily as needed for mild constipation. [provider]  Active   sodium zirconium cyclosilicate (LOKELMA) 10 g PACK packet [id number] Yes Take 10 g by mouth daily as needed (as directed by transplant team). [provider]  Active   sulfamethoxazole -trimethoprim  (BACTRIM ) 400-80 MG tablet [id number] Yes Take 1 tablet by mouth 3 (three) times a week. [provider]  Active            Med Note LORICE CATHLEAN VEAR Pablo Jul 06, 2023  4:22 PM) Take 1 tablet by mouth every Monday, Wednesday and Friday  tacrolimus (PROGRAF) 5 MG capsule [id number] Yes Take 10 mg by mouth 2 (two) times daily.  Patient taking differently: Take 8 mg by mouth 2 (two) times daily. 08/07/23 patient reports decreased to 8mg    [provider]  Active   tirzepatide  (MOUNJARO ) 5 MG/0.5ML Pen [id number] Yes Inject 5 mg into the skin once a week. Willo Mini, NP  Active            Med Note LORICE CATHLEAN VEAR Pablo Jul 06, 2023  4:24 PM) Wait to take this until your doctor or other care provider tells you to start again  valGANciclovir (VALCYTE) 450 MG tablet [id number] Yes Take 450 mg by mouth daily. [provider]  Active   valsartan  (DIOVAN ) 160 MG tablet [id number]  Take 1 tablet (160 mg total) by mouth daily.  Patient not taking: Reported on 08/07/2023   Willo Mini, NP  Active             Recommendation:   Referral to: CCM  Follow Up Plan:   Referral to RN Case Manager Closing From:  Transitions of Care Program  Shona Prow RN, CCM [facility]  VBCI-Population Health RN Care Manager [phone]

## 2023-08-10 ENCOUNTER — Telehealth: Payer: Self-pay | Admitting: *Deleted

## 2023-08-10 NOTE — Progress Notes (Signed)
 Complex Care Management Note  Care Guide Note 08/10/2023 Name: [name] MRN: [id number] DOB: 1980-04-16  Hunter Villarreal is a 43 y.o. year old male who sees Willo Mini, NP for primary care. I reached out to Hunter Villarreal by phone today to offer complex care management services.  [name] was given information about Complex Care Management services today including:   The Complex Care Management services include support from the care team which includes your Nurse Care Manager, Clinical Social Worker, or Pharmacist.  The Complex Care Management team is here to help remove barriers to the health concerns and goals most important to you. Complex Care Management services are voluntary, and the patient may decline or stop services at any time by request to their care team member.   Complex Care Management Consent Status: Patient agreed to services and verbal consent obtained.   Follow up plan:  Telephone appointment with complex care management team member scheduled for:  08/17/2023  Encounter Outcome:  Patient Scheduled  Thedford Franks, CMA [place]  [facility], [facility] Guide Direct Dial: [phone]  Fax: [phone] Website: [email or web address]

## 2023-08-17 ENCOUNTER — Telehealth: Payer: Self-pay

## 2023-08-20 NOTE — Progress Notes (Signed)
   08/21/2023 Name: Hunter Villarreal MRN: [id number] DOB: [birth year]  Hunter Villarreal is a 43 y.o. year old male who presented for a telephone visit.   They were referred to the pharmacist by their PCP for assistance in managing diabetes.   Subjective/Objective:  Care Team: Primary Care Provider: Willo Mini, NP ; Next Scheduled Visit: 9/8  Diabetes: Current medications: Lantus  26 units in the morning, Novolog  6 units + SSI TID before meals, Mounjaro  2.5mg  weekly -Mounjaro  recently resumed by transplant team at 2.5mg  weekly dose- patient has had two doses at this time and does endorse some diarrhea by Friday after taking injection the previous Saturday -A1c recently improved at 7.6%, down from 8.5% in April -Using Dexcom G7 sensors with mobile app for CGM -Current glucose readings: vary- patient states FBG can be 150-180, with post-prandial values reaching 300 at times -Patient has experienced some hypoglycemia, which Dexcom will alert him to.  He will have a few pieces of candy or some regular soda to resolve -Usually works 3rd shift but is not currently, so meal patterns have differed from his norm- not currently having breakfast regularly -Transplant team is currently managing medications based on recent kidney transplant; he follows-up with this team every Friday currently  Assessment/Plan:   Diabetes: -Currently uncontrolled with A1c >7% -Continue current regimen at this time and regular follow-up with transplant team and PCP for medication management -Continue Dexcom G7 for CGM  Follow Up Plan: 1 month  [name], PharmD, DPLA

## 2023-08-21 ENCOUNTER — Other Ambulatory Visit: Payer: Self-pay

## 2023-09-09 ENCOUNTER — Other Ambulatory Visit: Payer: Self-pay

## 2023-09-09 NOTE — Patient Instructions (Signed)
 Visit Information  Thank you for taking time to visit with me today. Please don't hesitate to contact me if I can be of assistance to you before our next scheduled telephone appointment.  Our next appointment is by telephone on October 1 at 3:30 pm Oct Following is a copy of your care plan:   Goals Addressed             This Visit's Progress    VBCI RN Care Plan       Problems:  Chronic Disease Management support and education needs related to DMII  Goal: Over the next 6 months the Patient will continue to work with RN Care Manager and/or Social Worker to address care management and care coordination needs related to DMII as evidenced by adherence to care management team scheduled appointments      Interventions:   Diabetes Interventions: Assessed patient's understanding of A1c goal: <7% Reviewed medications with patient and discussed importance of medication adherence Counseled on importance of regular laboratory monitoring as prescribed Discussed plans with patient for ongoing care management follow up and provided patient with direct contact information for care management team Review of patient status, including review of consultants reports, relevant laboratory and other test results, and medications completed Screening for signs and symptoms of depression related to chronic disease state  Assessed social determinant of health barriers Lab Results  Component Value Date   HGBA1C 8.5 (A) 04/14/2023    Patient Self-Care Activities:  Attend all scheduled provider appointments Attend church or other social activities Call pharmacy for medication refills 3-7 days in advance of running out of medications Call provider office for new concerns or questions  Perform all self care activities independently  Perform IADL's (shopping, preparing meals, housekeeping, managing finances) independently Take medications as prescribed   check blood sugar at prescribed times: utilizes  Dexcom for real-time blood sugar monitoring throughout each day check feet daily for cuts, sores or redness drink 6 to 8 glasses of water each day eat fish at least once per week fill half of plate with vegetables limit fast food meals to no more than 1 per week manage portion size  Plan:  The patient has been provided with contact information for the care management team and has been advised to call with any health related questions or concerns.           VBCI RN Care Plan       Problems:  Chronic Disease Management support and education needs related to Recent Kidney Transplant recipient (07/02/2023 @ [facility])  Goal: Over the next 6 months the Patient will continue to work with RN Care Manager and/or Social Worker to address care management and care coordination needs related to Recent Kidney Transplant recipient as evidenced by adherence to care management team scheduled appointments      Interventions:   Surgery (Recent Kidney Transplant recipient at [facility] 07/02/2023): Evaluation of current treatment plan related to Post Kidney Transplant surgery reviewed post-operative instructions with patient/caregiver reviewed medications with patient and addressed questions reviewed scheduled provider appointments with patient- patient confirmed that he is followed by Kidney Transplant team at [facility]  Reviewed importance of taking anti-rejection meds such as Prograf - ON TIME and not miss a dose - patient states he is very cognizant of the importance of taking his anti-rejection meds and sets alarms, 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  Patient Self-Care Activities:  Attend all scheduled provider appointments Attend church or other social  activities Call pharmacy for medication refills 3-7 days in advance of running out of medications Call provider office for new concerns or questions  Perform all self care activities independently  Perform IADL's (shopping, preparing meals, housekeeping, managing finances) independently Take medications as prescribed    Plan:  The patient has been provided with contact information for the care management team and has been advised to call with any health related questions or concerns.              Patient verbalizes understanding of instructions and care plan provided today and agrees to view in MyChart. Active MyChart status and patient understanding of how to access instructions and care plan via MyChart confirmed with patient.     The patient has been provided with contact information for the care management team and has been advised to call with any health related questions or concerns.   Please call the care guide team at [phone] if you need to cancel or reschedule your appointment.   Please call 1-800-273-TALK (toll free, 24 hour hotline) if you are experiencing a Mental Health or Behavioral Health Crisis or need someone to talk to.  Damisha Wolff A. Gordy RN, BA, [facility], CRRN [place]  [facility] Population Health RN Care Manager Direct Dial: [phone]  Fax: [phone]

## 2023-09-09 NOTE — Patient Outreach (Signed)
 Complex Care Management   Visit Note  09/09/2023  Name:  Hunter Villarreal MRN: [id number] DOB: [birth year]  Situation: Referral received for Complex Care Management related to recent Kidney Transplant on 07/02/23/need for lifelong anti-rejection, immunosuppression therapy, and DM2.  I obtained verbal consent from Patient.  Visit completed with Patient  on the phone  Background:   Past Medical History:  Diagnosis Date   Diabetes mellitus without complication (HCC)    ESRD (end stage renal disease) on dialysis (HCC) 03/10/2023   History of shingles    Hypertension     Assessment: Patient Reported Symptoms:  Cognitive Cognitive Status: No symptoms reported, Alert and oriented to person, place, and time, Normal speech and language skills, Insightful and able to interpret abstract concepts Cognitive/Intellectual Conditions Management [RPT]: None reported or documented in medical history or problem list   Health Maintenance Behaviors: Annual physical exam, Healthy diet Healing Pattern: Unsure Health Facilitated by: Healthy diet, Rest  Neurological Neurological Review of Symptoms: No symptoms reported    HEENT HEENT Symptoms Reported: No symptoms reported      Cardiovascular Cardiovascular Symptoms Reported: No symptoms reported    Respiratory Respiratory Symptoms Reported: No symptoms reported    Endocrine Endocrine Symptoms Reported: No symptoms reported Is patient diabetic?: Yes Is patient checking blood sugars at home?: Yes List most recent blood sugar readings, include date and time of day: utilizes Dexcom monitor to check blood sugar readings throughout the day Endocrine Self-Management Outcome: 4 (good)  Gastrointestinal Gastrointestinal Symptoms Reported: No symptoms reported      Genitourinary Genitourinary Symptoms Reported: No symptoms reported Additional Genitourinary Details: Recently underwent kidney transplant 07/02/23 at 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  Integumentary Integumentary Symptoms Reported: No symptoms reported    Musculoskeletal Musculoskelatal Symptoms Reviewed: No symptoms reported        Psychosocial Psychosocial Symptoms Reported: No symptoms reported   Major Change/Loss/Stressor/Fears (CP): Medical condition, self Quality of Family Relationships: helpful, involved, supportive    09/09/2023    PHQ2-9 Depression Screening   Little interest or pleasure in doing things Not at all  Feeling down, depressed, or hopeless Not at all  PHQ-2 - Total Score 0  Trouble falling or staying asleep, or sleeping too much    Feeling tired or having little energy    Poor appetite or overeating     Feeling bad about yourself - or that you are a failure or have let yourself or your family down    Trouble concentrating on things, such as reading the newspaper or watching television    Moving or speaking so slowly that other people could have noticed.  Or the opposite - being so fidgety or restless that you have been moving around a lot more than usual    Thoughts that you would be better off dead, or hurting yourself in some way    PHQ2-9 Total Score    If you checked off any problems, how difficult have these problems made it for you to do your work, take care of things at  home, or get along with other people    Depression Interventions/Treatment      There were no vitals filed for this visit.  Medications Reviewed Today     Reviewed by  Gordy Channing LABOR, RN (Registered Nurse) on 09/09/23 at [phone]  Med List Status: <None>   Medication Order Taking? Sig Documenting Provider Last Dose Status Informant  amLODipine (NORVASC) 10 MG tablet [id number]  Take 10 mg by mouth daily.  Patient not taking: Reported on 09/09/2023   [provider]  Active   aspirin EC 81 MG tablet [id number] Yes Take 81 mg by mouth daily. Swallow whole. - patient states he was told 08/07/23 by transplant team he no longer needs to take this [provider]  Active   atorvastatin  (LIPITOR) 40 MG tablet [id number]  Take 1 tablet (40 mg total) by mouth daily.  Patient not taking: Reported on 09/09/2023   Willo Mini, NP  Active   blood glucose meter kit and supplies KIT [id number] Yes Dispense based on patient and insurance preference. Use up to four times daily as directed. Please include lancets, test strips, control solution. Willo Mini, NP  Active   Continuous Glucose Sensor (DEXCOM G7 SENSOR) MISC [id number] Yes 1 each by Does not apply route every 14 (fourteen) days. [provider]  Active   famotidine (PEPCID) 20 MG tablet [id number] Yes Take 20 mg by mouth at bedtime. [provider]  Active   fluconazole (DIFLUCAN) 50 MG tablet [id number] Yes Take 50 mg by mouth daily. [provider]  Active   glucose blood (ONETOUCH VERIO) test strip [id number] Yes 1 each by Other route 2 (two) times daily. And lancets 2/day Kassie Mallick, MD  Active   hydrochlorothiazide (HYDRODIURIL) 12.5 MG tablet [id number] Yes Take 12.5 mg by mouth daily. [provider]  Active   insulin  aspart (NOVOLOG ) 100 UNIT/ML FlexPen [id number] Yes Inject 6 Units into the skin 3 (three) times daily with meals. [provider]  Active   Insulin  Pen Needle (BD PEN NEEDLE NANO 2ND GEN) 32G X 4 MM MISC [id number] Yes by Does not apply route. [provider]  Active   LANTUS  SOLOSTAR 100 UNIT/ML Solostar Pen [id number] Yes INJECT 20 UNITS  SUBCUTANEOUSLY ONCE DAILY Jessup, Joy, NP  Active   magnesium oxide (MAG-OX) 400 (240 Mg) MG tablet [id number] Yes Take 400 mg by mouth daily as needed (as directed by transplant team). [provider]  Active   midodrine (PROAMATINE) 5 MG tablet [id number] Yes Take 5 mg by mouth 2 (two) times daily as needed (for BP < 105/70). [provider]  Active   mycophenolate (MYFORTIC) 180 MG EC tablet [id number] Yes Take 180 mg by mouth 2 (two) times daily. [provider]  Active   polyethylene glycol (MIRALAX / GLYCOLAX) 17 g packet [id number] Yes Take 17 g by mouth daily as needed for mild constipation or moderate constipation. [provider]  Active   predniSONE (DELTASONE) 5 MG tablet [id number] Yes Take 20 mg by mouth daily with breakfast. Taking 5mg  daily [provider]  Active   senna-docusate (SENOKOT-S) 8.6-50 MG tablet [id number] Yes Take 1 tablet by mouth 2 (two) times daily as needed for mild constipation. [provider]  Active   sodium zirconium cyclosilicate (LOKELMA) 10 g PACK packet [id number] Yes Take 10 g by mouth daily as needed (as directed by transplant team). [provider]  Active   sulfamethoxazole -trimethoprim  (BACTRIM ) 400-80 MG tablet [id number] Yes Take 1  tablet by mouth 3 (three) times a week. [provider]  Active            Med Note LORICE CATHLEAN VEAR Pablo Jul 06, 2023  4:22 PM) Take 1 tablet by mouth every Monday, Wednesday and Friday  tacrolimus (PROGRAF) 5 MG capsule [id number] Yes Take 10 mg by mouth 2 (two) times daily. [provider]  Active   tirzepatide  (MOUNJARO ) 5 MG/0.5ML Pen [id number] Yes Inject 5 mg into the skin once a week. Willo Mini, NP  Active            Med Note LORICE CATHLEAN VEAR Pablo Jul 06, 2023  4:24 PM) Wait to take this until your doctor or other care provider tells you to start again  valGANciclovir (VALCYTE) 450 MG tablet [id number] Yes Take 450 mg by mouth daily.  [provider]  Active             Recommendation:   (/08/2023 at 10:50 pm is next PCP Follow-up Specialty provider follow-up with [facility] Kidney Transplant Team as scheduled.  Follow Up Plan:   Telephone follow up appointment date/time:  10/07/2023 at 3:30 pm with RN Care Manager Channing LITTIE Channing A. Gordy RN, BA, [facility], CRRN [place]  [facility] Population Health RN Care Manager Direct Dial: [phone]  Fax: [phone]

## 2023-09-14 ENCOUNTER — Encounter: Payer: Self-pay | Admitting: Medical-Surgical

## 2023-09-14 ENCOUNTER — Ambulatory Visit (INDEPENDENT_AMBULATORY_CARE_PROVIDER_SITE_OTHER): Admitting: Medical-Surgical

## 2023-09-14 VITALS — BP 91/60 | HR 88 | Resp 20 | Ht 71.0 in | Wt 153.0 lb

## 2023-09-14 DIAGNOSIS — E1159 Type 2 diabetes mellitus with other circulatory complications: Secondary | ICD-10-CM

## 2023-09-14 DIAGNOSIS — D849 Immunodeficiency, unspecified: Secondary | ICD-10-CM

## 2023-09-14 DIAGNOSIS — Z794 Long term (current) use of insulin: Secondary | ICD-10-CM

## 2023-09-14 DIAGNOSIS — E782 Mixed hyperlipidemia: Secondary | ICD-10-CM

## 2023-09-14 DIAGNOSIS — I10 Essential (primary) hypertension: Secondary | ICD-10-CM

## 2023-09-14 NOTE — Progress Notes (Signed)
 Established patient visit   History of Present Illness   Discussed the use of AI scribe software for clinical note transcription with the patient, who gave verbal consent to proceed.  History of Present Illness   Hunter Villarreal is a 43 year old male with diabetes who presents for follow-up of his blood sugar management.  Glycemic control - Hemoglobin A1c decreased from 9.3% in June to 7.6% in August - Fasting blood glucose ranges from 150 to 180 mg/dL - Post-awakening blood glucose rises to 200-220 mg/dL - No significant symptoms of hyperglycemia  Hypoglycemic episodes - Episodes occur between 5 PM and 9 PM, often after dinner - Symptoms include shakiness, weakness - Episodes managed with quick carbohydrates, usually [employer] - Occasionally lacks desire to eat dinner, which may contribute to hypoglycemia  Antihyperglycemic and immunosuppressive therapy - Uses Mounjaro  5 mg - Uses fast-acting insulin , 4 units with meals, adjusting dose for readings over 120 mg/dL with a sliding scale. - Takes Myfortic, prednisone, Bactrim , Prograf, and Valcyte  Blood pressure and orthostatic symptoms - Not on antihypertensive medications - Uses midodrine as needed for low blood pressure  Gastrointestinal symptoms - Uses Miralax and Senokot as needed for constipation  Psychosocial stressors and activity level - Experiences stress and family tension, but does not find it overwhelming - Desires increased physical activity      Physical Exam   Physical Exam Vitals reviewed.  Constitutional:      General: He is not in acute distress.    Appearance: Normal appearance. He is not ill-appearing.  HENT:     Head: Normocephalic and atraumatic.  Cardiovascular:     Rate and Rhythm: Normal rate and regular rhythm.     Pulses: Normal pulses.     Heart sounds: Normal heart sounds. No murmur heard.    No friction rub. No gallop.  Pulmonary:     Effort: Pulmonary effort is  normal. No respiratory distress.     Breath sounds: Normal breath sounds.  Skin:    General: Skin is warm and dry.  Neurological:     Mental Status: He is alert and oriented to person, place, and time.  Psychiatric:        Mood and Affect: Mood normal.        Behavior: Behavior normal.        Thought Content: Thought content normal.        Judgment: Judgment normal.    Assessment & Plan   Assessment and Plan    Type 2 diabetes mellitus with hypoglycemia and hyperglycemia Blood glucose improved, fasting glucose 150-180 mg/dL, post-wake 779 mg/dL, hypoglycemic episodes noted, GMI 7.3%. - Continue Mounjaro  5 mg. - Continue fast-acting insulin  4 units with meals, adjust based on blood glucose levels. - Encourage protein intake to stabilize blood glucose levels. - Consider evening snack with protein and carbohydrates to prevent nocturnal hypoglycemia. - Schedule follow-up in 3 months.  Status post kidney transplant with immunosuppression Maintaining immunosuppression regimen. Following closely with Renal Transplant - Continue Bactrim , Myfortic, Prograf, and Valcyte. - Coordinate medication management with transplant team.  Fungal infection prophylaxis Prophylactic fluconazole for prevention given continuous use of Bactrim . - Continue fluconazole 50 mg.  Constipation Intermittent constipation managed with Miralax and Senokot. - Continue Miralax as needed. - Continue Senokot as needed.      Follow up   Return in about 3 months (around 12/14/2023) for DM/HTN/HLD follow up. __________________________________ Zada FREDRIK Palin, DNP, APRN,  FNP-BC Primary Care and Sports Medicine [facility] [place]

## 2023-09-20 NOTE — Progress Notes (Unsigned)
   09/21/2023 Name: Hunter Villarreal MRN: [id number] DOB: [birth year]  Outreach attempt for scheduled telephone visit to follow-up on management of diabetes was not successful, but I was able to leave a HIPAA compliant voicemail with my direct number.  I will try to call the patient again next week if I do not hear back.  [name], PharmD, DPLA

## 2023-09-21 ENCOUNTER — Other Ambulatory Visit

## 2023-10-01 ENCOUNTER — Other Ambulatory Visit: Payer: Self-pay

## 2023-10-01 DIAGNOSIS — E1159 Type 2 diabetes mellitus with other circulatory complications: Secondary | ICD-10-CM

## 2023-10-01 NOTE — Progress Notes (Signed)
   10/01/2023 Name: Hunter Villarreal MRN: [id number] DOB: 26-Oct-1980  Hunter Villarreal is a 43 y.o. year old male who presented for a telephone visit.   They were referred to the pharmacist by their PCP for assistance in managing diabetes.   Subjective/Objective:  Care Team: Primary Care Provider: Willo Mini, NP ; Next Scheduled Visit: December  Diabetes: Current medications: Lantus  20 units in the morning, Novolog  4 units + SSI TID before meals, Mounjaro  2.5mg  weekly -Mounjaro  recently resumed by transplant team at 2.5mg  weekly dose- patient has had 6-7 doses at this time and does endorse some diarrhea by Friday after taking injection the previous Saturday -A1c recently improved at 7.6%, down from 8.5% in April -Using Dexcom G7 sensors with mobile app for CGM and is experiencing some issues with them falling off or losing signal even when within appropriate distance range -Current glucose readings: vary- patient states FBG averaging 150-180 -Patient has experienced occasional hypoglycemia, which Dexcom will alert him to.  He will have a few pieces of candy or some regular soda to resolve -Usually works 3rd shift but is not currently, so meal patterns have differed from his norm- not currently having breakfast regularly- he will return to work in October -Transplant team is currently managing medications based on recent kidney transplant; he follows-up with this team every other week currently, but this will go to monthly in October  Assessment/Plan:   Diabetes: -Currently uncontrolled with A1c >7% -Continue current regimen at this time and regular follow-up with transplant team and PCP for medication management -Continue Dexcom G7 for CGM- ran test claim for [facility] 3+ sensors, and these would be $74.99, and Dexcomg G7 is no charge.  I did advise patient to let me know if he would like to try Libre 3+ in the future to see if these would work better for him   Follow Up Plan: 11/4  [name], PharmD, DPLA

## 2023-10-07 ENCOUNTER — Other Ambulatory Visit: Payer: Self-pay

## 2023-10-07 NOTE — Patient Instructions (Signed)
 Visit Information  Thank you for taking time to visit with me today. Please don't hesitate to contact me if I can be of assistance to you before our next scheduled telephone appointment.  Our next appointment is by telephone on 10/08/2023 at 4pm.  Following is a copy of your care plan:   Goals Addressed             This Visit's Progress    VBCI RN Care Plan       Problems:  Chronic Disease Management support and education needs related to DMII  Goal: Over the next 6 months the Patient will continue to work with RN Care Manager and/or Social Worker to address care management and care coordination needs related to DMII as evidenced by adherence to care management team scheduled appointments      Interventions:   Diabetes Interventions: Assessed patient's understanding of A1c goal: <7% Reviewed medications with patient and discussed importance of medication adherence Counseled on importance of regular laboratory monitoring as prescribed Discussed plans with patient for ongoing care management follow up and provided patient with direct contact information for care management team Review of patient status, including review of consultants reports, relevant laboratory and other test results, and medications completed Screening for signs and symptoms of depression related to chronic disease state  Assessed social determinant of health barriers Lab Results  Component Value Date   HGBA1C 8.5 (A) 04/14/2023    Patient Self-Care Activities:  Attend all scheduled provider appointments Attend church or other social activities Call pharmacy for medication refills 3-7 days in advance of running out of medications Call provider office for new concerns or questions  Perform all self care activities independently  Perform IADL's (shopping, preparing meals, housekeeping, managing finances) independently Take medications as prescribed   check blood sugar at prescribed times: utilizes Dexcom for  real-time blood sugar monitoring throughout each day check feet daily for cuts, sores or redness drink 6 to 8 glasses of water each day eat fish at least once per week fill half of plate with vegetables limit fast food meals to no more than 1 per week manage portion size  Plan:  The patient has been provided with contact information for the care management team and has been advised to call with any health related questions or concerns.   Next follow up appt with RN Care Manager Channing CROME is tomorrow, Thursday 10/08/23 at 4pm to follow up on requested concerns & issues (needs a new RX for Dexcom as previous one is broken, missed Endocrine referral appt and was not rescheduled until March of 2026 - requesting earlier Endocrinologist f/u if at all possible.          VBCI RN Care Plan       Problems:  Chronic Disease Management support and education needs related to Recent Kidney Transplant recipient (07/02/2023 @ [facility])  Goal: Over the next 6 months the Patient will continue to work with RN Care Manager and/or Social Worker to address care management and care coordination needs related to Recent Kidney Transplant recipient as evidenced by adherence to care management team scheduled appointments      Interventions:   Surgery (Recent Kidney Transplant recipient at [facility] 07/02/2023): Evaluation of current treatment plan related to Post Kidney Transplant surgery reviewed post-operative instructions with patient/caregiver reviewed medications with patient and addressed questions reviewed scheduled provider appointments with patient- patient confirmed that he is followed by Kidney Transplant team at  [facility]  Reviewed importance of taking anti-rejection meds such as Prograf - ON TIME and not miss a dose - patient states he is very cognizant of the importance of taking his anti-rejection meds and sets alarms, 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  Patient Self-Care Activities:  Attend all scheduled provider appointments Attend church or other social activities Call pharmacy for medication refills 3-7 days in advance of running out of medications Call provider office for new concerns or questions  Perform all self care activities independently  Perform IADL's (shopping, preparing meals, housekeeping, managing finances) independently Take medications as prescribed    Plan:  The patient has been provided with contact information for the care management team and has been advised to call with any health related questions or concerns.  Next follow up appt with RN Care Manager Channing CROME is tomorrow, Thursday 10/08/23 at 4pm to follow up on requested concerns & issues (needs a new RX for Dexcom as previous one is broken, missed Endocrine referral appt and was not rescheduled until March of 2026 - requesting earlier Endocrinologist f/u if at all possible.             Patient verbalizes understanding of instructions and care plan provided today and agrees to view in MyChart. Active MyChart status and patient understanding of how to access instructions and care plan via MyChart confirmed with patient.     The patient has been provided with contact information for the care management team and has been advised to call with any health related questions or concerns.   Please call the care guide team at [phone] if you need to cancel or reschedule your appointment.   Please call 1-800-273-TALK (toll free, 24 hour hotline) if you are experiencing a Mental Health or Behavioral Health Crisis or need someone to talk to.  Herold Salguero A. Gordy RN, BA, [facility], CRRN [place]  [facility] Population Health RN Care Manager Direct Dial: [phone]  Fax:  [phone]

## 2023-10-07 NOTE — Patient Outreach (Signed)
 Complex Care Management   Visit Note  10/07/2023  Name:  Hunter Villarreal MRN: [id number] DOB: February 01, 1980  Situation: Referral received for Complex Care Management related to Diabetes with Complications and s/p patient underwent Recent Living Kidney Transplant on 07/02/2023 I obtained verbal consent from Patient.  Visit completed with Patient  on the phone  Background:   Past Medical History:  Diagnosis Date   Diabetes mellitus without complication (HCC)    ESRD (end stage renal disease) on dialysis (HCC) 03/10/2023   History of shingles    Hypertension     Assessment: Patient Reported Symptoms:  Cognitive Cognitive Status: No symptoms reported, Alert and oriented to person, place, and time, Normal speech and language skills, Insightful and able to interpret abstract concepts Cognitive/Intellectual Conditions Management [RPT]: None reported or documented in medical history or problem list   Health Maintenance Behaviors: Annual physical exam Healing Pattern: Unsure Health Facilitated by: Healthy diet  Neurological Neurological Review of Symptoms: No symptoms reported    HEENT HEENT Symptoms Reported: No symptoms reported      Cardiovascular Cardiovascular Symptoms Reported: No symptoms reported Does patient have uncontrolled Hypertension?: No Cardiovascular Self-Management Outcome: 4 (good)  Respiratory Respiratory Symptoms Reported: No symptoms reported    Endocrine Endocrine Symptoms Reported: Other Other symptoms related to hypoglycemia or hyperglycemia: Reports decreased appetite w/ weekly Mounjaro  and usually has 1-2 days of less appetite, increased loose stools following injection and that leads him to sometimes skip breakfast, then gets shaky from low blood sugar, and then over compensates with a soda that spikes his BS to mid 200's. Is patient diabetic?: Yes Is patient checking blood sugars at home?: Yes List most recent blood sugar readings, include date and time of day: Pt  reports broken Dexcom, will send message to PCP's office to request replacement RX -> [facility] in [place] Endocrine Self-Management Outcome: 3 (uncertain) Endocrine Comment: Saw PCP last on 09/14/2023, A1C down to 7.6 from previous 9.3. Fasting BSs 130s - 140s, BSs taken prior to meals range from 110s - 190s  Gastrointestinal Gastrointestinal Symptoms Reported: Diarrhea, Change in appetite Additional Gastrointestinal Details: Reports lessened appetite and some loose stools 1-2 days following weekly Mounjaro  injections Gastrointestinal Self-Management Outcome: 4 (good)    Genitourinary Genitourinary Symptoms Reported: No symptoms reported Additional Genitourinary Details: as of 10/07/23, patient is 97 days post op from undergoing Living Kidney Transplant on 07/02/23. BPs 120s-130s/60s-70s, UO matches intake of approx 64 ounces/day    Integumentary Integumentary Symptoms Reported: No symptoms reported    Musculoskeletal Musculoskelatal Symptoms Reviewed: No symptoms reported        Psychosocial Psychosocial Symptoms Reported: No symptoms reported   Major Change/Loss/Stressor/Fears (CP): Medical condition, self Quality of Family Relationships: helpful, involved, supportive Do you feel physically threatened by others?: No    10/07/2023    PHQ2-9 Depression Screening   Little interest or pleasure in doing things Not at all  Feeling down, depressed, or hopeless Not at all  PHQ-2 - Total Score 0  Trouble falling or staying asleep, or sleeping too much    Feeling tired or having little energy    Poor appetite or overeating     Feeling bad about yourself - or that you are a failure or have let yourself or your family down    Trouble concentrating on things, such as reading the newspaper or watching television    Moving or speaking so slowly that other people could have noticed.  Or the opposite - being so fidgety or  restless that you have been moving around a lot more than usual     Thoughts that you would be better off dead, or hurting yourself in some way    PHQ2-9 Total Score    If you checked off any problems, how difficult have these problems made it for you to do your work, take care of things at home, or get along with other people    Depression Interventions/Treatment      There were no vitals filed for this visit.  Medications Reviewed Today     Reviewed by Gordy Channing LABOR, RN (Registered Nurse) on 10/07/23 at 1620  Med List Status: <None>   Medication Order Taking? Sig Documenting Provider Last Dose Status Informant  Continuous Glucose Sensor (DEXCOM G7 SENSOR) MISC [id number] Yes 1 each by Does not apply route every 14 (fourteen) days. [provider]  Active   fluconazole (DIFLUCAN) 50 MG tablet [id number] Yes Take 50 mg by mouth daily. [provider]  Active   insulin  aspart (NOVOLOG ) 100 UNIT/ML FlexPen [id number] Yes Inject 6 Units into the skin 3 (three) times daily with meals. [provider]  Active   Insulin  Pen Needle (BD PEN NEEDLE NANO 2ND GEN) 32G X 4 MM MISC [id number] Yes by Does not apply route. [provider]  Active   LANTUS  SOLOSTAR 100 UNIT/ML Solostar Pen [id number] Yes INJECT 20 UNITS SUBCUTANEOUSLY ONCE DAILY  Patient taking differently: Inject 20 Units into the skin daily.   Willo Mini, NP  Active   midodrine (PROAMATINE) 5 MG tablet [id number] Yes Take 5 mg by mouth 2 (two) times daily as needed (for BP < 105/70). [provider]  Active   MOUNJARO  2.5 MG/0.5ML Pen [id number] Yes Inject 2.5 mg into the skin once a week. [provider]  Active   mycophenolate (MYFORTIC) 180 MG EC tablet [id number] Yes Take 180 mg by mouth 2 (two) times daily. [provider]  Active   polyethylene glycol (MIRALAX / GLYCOLAX) 17 g packet [id number] Yes Take 17 g by mouth daily as needed for mild constipation or moderate constipation. [provider]  Active   predniSONE (DELTASONE) 5  MG tablet [id number] Yes Take 20 mg by mouth daily with breakfast. Taking 5mg  daily [provider]  Active   senna-docusate (SENOKOT-S) 8.6-50 MG tablet [id number] Yes Take 1 tablet by mouth 2 (two) times daily as needed for mild constipation. [provider]  Active   sulfamethoxazole -trimethoprim  (BACTRIM ) 400-80 MG tablet [id number] Yes Take 1 tablet by mouth 3 (three) times a week. [provider]  Active            Med Note LORICE CATHLEAN VEAR Pablo Jul 06, 2023  4:22 PM) Take 1 tablet by mouth every Monday, Wednesday and Friday  tacrolimus (PROGRAF) 5 MG capsule [id number] Yes Take 10 mg by mouth 2 (two) times daily. [provider]  Active   valGANciclovir (VALCYTE) 450 MG tablet [id number] Yes Take 450 mg by mouth daily. [provider]  Active             Recommendation:   Specialty provider follow-up - will try to obtain change in (rescheduled) Endocrinologist F/U in March 2026 to date this year, 2025.  Referral to: Clinical Pharmacist working with PCP, Mini Willo NP for Diabetic Management concerns and Medication review  Follow Up Plan:   Telephone follow up appointment date/time:  10/08/2023 with RN Care Manager, Channing CROME  tomorrow , Thursday 10/08/23 at  4pm to follow up on requested concerns & issues (needs a new RX for Dexcom as previous one is broken, missed Endocrine referral appt and was not rescheduled until March of 2026 - requesting earlier Endocrinologist f/u if at all possible).   Irelyn Perfecto A. Gordy RN, BA, [facility], CRRN [place]  [facility] Population Health RN Care Manager Direct Dial: [phone]  Fax: [phone]

## 2023-10-08 ENCOUNTER — Other Ambulatory Visit

## 2023-11-09 NOTE — Progress Notes (Unsigned)
   10/01/2023 Name: [name] MRN: [id number] DOB: 26-Oct-1980  Hunter Villarreal is a 43 y.o. year old male who presented for a telephone visit.   They were referred to the pharmacist by their PCP for assistance in managing diabetes.   Subjective/Objective:  Care Team: Primary Care Provider: Willo Mini, NP ; Next Scheduled Visit: December  Diabetes: Current medications: Lantus  20 units in the morning, Novolog  4 units + SSI TID before meals, Mounjaro  2.5mg  weekly -Mounjaro  recently resumed by transplant team at 2.5mg  weekly dose- patient has had 6-7 doses at this time and does endorse some diarrhea by Friday after taking injection the previous Saturday -A1c recently improved at 7.6%, down from 8.5% in April -Using Dexcom G7 sensors with mobile app for CGM and is experiencing some issues with them falling off or losing signal even when within appropriate distance range -Current glucose readings: vary- patient states FBG averaging 150-180 -Patient has experienced occasional hypoglycemia, which Dexcom will alert him to.  He will have a few pieces of candy or some regular soda to resolve -Usually works 3rd shift but is not currently, so meal patterns have differed from his norm- not currently having breakfast regularly- he will return to work in October -Transplant team is currently managing medications based on recent kidney transplant; he follows-up with this team every other week currently, but this will go to monthly in October  Assessment/Plan:   Diabetes: -Currently uncontrolled with A1c >7% -Continue current regimen at this time and regular follow-up with transplant team and PCP for medication management -Continue Dexcom G7 for CGM- ran test claim for [facility] 3+ sensors, and these would be $74.99, and Dexcomg G7 is no charge.  I did advise patient to let me know if he would like to try Libre 3+ in the future to see if these would work better for him   Follow Up Plan: 11/4  [name], PharmD, DPLA

## 2023-11-10 ENCOUNTER — Other Ambulatory Visit: Payer: Self-pay

## 2023-11-18 NOTE — Progress Notes (Addendum)
   11/19/2023 Name: Hunter Villarreal MRN: [id number] DOB: [birth year]  Hunter Villarreal is a 43 y.o. year old male who presented for a telephone visit.   They were referred to the pharmacist by their PCP for assistance in managing diabetes.   Subjective/Objective:  Care Team: Primary Care Provider: Willo Mini, NP ; Next Scheduled Visit: 12/8  Diabetes: Current medications: Lantus  26 units in the morning, Novolog  6 units + SSI TID before meals -Mounjaro  recently stopped due to GI distress, weight loss, and hypoglycemia along with insulin  use -When Mounjaro  was stopped, Lantus  was increased to 24 units daily and Novolog  4 units TID with meals + SSI -Patient has now increased Lantus  to 26 units and Novolog  to 6 units TID with meals + SSI based on elevated BG readings.  States FBG continues to average around 130, but his overall average (according to [facility] data) is 210 -Was having some hypoglycemia when Mounjaro  was on board along with insulins, but this has mostly resolved now -Currently out of Novolog  and refill will not be delivered until tomorrow -A1c recently improved 6.8% on 9/26 -Using Dexcom G7 sensors with mobile app for CGM  -Was scheduled to see endocrinology but was a few minutes late for visit, and this go rescheduled for March 2026 -Transplant team is currently managing medications based on recent kidney transplant; he follows-up with this team every month currently,  Assessment/Plan:   Diabetes: -Currently controlled with A1c <7%, but BG average is elevated -Continue current regimen at this time and regular follow-up with transplant team  -Endocrinology in March, but they will call him if something opens sooner -Continue Dexcom G7 for CGM -Order pending for 1 Novolog  pen to go to [facility] to see if they can fill this to prevent patient from missing any doses  Follow Up Plan: Will check on Novolog  once order signed, notify patient, and schedule a follow-up for Jan  [name], PharmD, DPLA

## 2023-11-19 ENCOUNTER — Other Ambulatory Visit: Payer: Self-pay

## 2023-11-19 MED ORDER — INSULIN ASPART 100 UNIT/ML FLEXPEN: 6.0000 [IU] | PEN_INJECTOR | Freq: Three times a day (TID) | SUBCUTANEOUS | 0 refills | Status: AC

## 2023-11-19 NOTE — Progress Notes (Signed)
   11/19/2023  Patient ID: Hunter Villarreal, male   DOB: [birth year], 43 y.o.   MRN: [id number]  Contacted [facility], and insurance is rejecting claim for 1 Novolog  pen.  Pharmacy is going to call insurance to see if an override can be placed to cover this until patient receives delivery of this medication tomorrow.  Made patient aware and recommended he follow back up with the pharmacy in another hour or two and verified he has their phone number.  Telephone follow-up visit scheduled for 1/16.  [name], PharmD, DPLA

## 2023-12-14 ENCOUNTER — Ambulatory Visit: Admitting: Medical-Surgical

## 2024-01-21 NOTE — Progress Notes (Signed)
" ° °  01/22/24 Name: ARAF CLUGSTON MRN: [id number] DOB: [birth year]  Hunter Villarreal is a 44 y.o. year old male who presented for a telephone visit.   They were referred to the pharmacist by their PCP for assistance in managing diabetes.   Subjective/Objective:  Care Team: Primary Care Provider: Willo Mini, NP ; Next Scheduled Visit: none at this time  Diabetes: Current medications: Lantus  26 units in the morning, Novolog  8 units TID with meals and 6 units between meals if BG goes >250 -Mounjaro  stopped due to GI distress, weight loss, and hypoglycemia -States BG has varied quite a bit and endorses this is likely due to returning to work and how/when he is using insulin  -Was having some hypoglycemia when Mounjaro  was on board along with insulins, but this has mostly resolved now -Using Dexcom G7 sensors with mobile app for CGM  -Was scheduled to see endocrinology but was a few minutes late for visit, and this got rescheduled for March 2026 -Transplant team is currently managing medications based on recent kidney transplant; he follows-up with this team every month currently -A1c 6.8% in September (per Care Everywhere) -Statin for ASCVD risk reduction:  atorvastatin  10mg  3x/week -No ACEi/ARB on board at this time -UACR 30-300 March 2025  Lab Results  Component Value Date   HGBA1C 8.5 (A) 04/14/2023   HGBA1C 8.7 01/09/2023   HGBA1C 6.2 (A) 03/04/2022      Component Value Date/Time   NA 140 08/02/2021 0000   K 5.7 (H) 08/02/2021 0000   CL 109 08/02/2021 0000   CO2 24 08/02/2021 0000   GLUCOSE 261 (H) 08/02/2021 0000   BUN 36 (H) 08/02/2021 0000   CREATININE 3.72 (H) 08/02/2021 0000   CALCIUM  8.1 (L) 08/02/2021 0000   PROT 5.4 (L) 08/02/2021 0000   ALBUMIN 4.1 11/20/2015 1718   AST 23 08/02/2021 0000   ALT 22 08/02/2021 0000   ALKPHOS 53 11/20/2015 1718   BILITOT 0.3 08/02/2021 0000   GFR 66.83 12/21/2018 0813   EGFR 13 09/01/2022 0000   GFRNONAA 47 (L) 04/02/2020 1030       Component Value Date/Time   CHOL 226 (H) 08/02/2021 0000   TRIG 179 (H) 08/02/2021 0000   HDL 70 08/02/2021 0000   CHOLHDL 3.2 08/02/2021 0000   VLDL 17 11/20/2015 1718   LDLCALC 126 (H) 08/02/2021 0000     Assessment/Plan:   Diabetes: -Currently controlled with A1c <7% -Continue current regimen at this time and regular follow-up with transplant team  -Endocrinology in March, but they will call him if something opens sooner -Continue Dexcom G7 for CGM -Patient missed recent PCP visit; advised he is due for a follow-up with Joy, and he states he plans to schedule   Follow Up Plan: 2/27  [name], PharmD, DPLA    "

## 2024-01-22 ENCOUNTER — Other Ambulatory Visit: Payer: Self-pay

## 2024-01-22 DIAGNOSIS — Z794 Long term (current) use of insulin: Secondary | ICD-10-CM

## 2024-01-22 DIAGNOSIS — E1159 Type 2 diabetes mellitus with other circulatory complications: Secondary | ICD-10-CM

## 2024-03-04 ENCOUNTER — Other Ambulatory Visit
